# Patient Record
Sex: Female | Born: 1970 | Hispanic: Refuse to answer | Smoking: Never smoker
Health system: Southern US, Community
[De-identification: ages and names within clinical notes are randomized; demographics above are authoritative.]

## PROBLEM LIST (undated history)

## (undated) DIAGNOSIS — M1990 Unspecified osteoarthritis, unspecified site: Secondary | ICD-10-CM

## (undated) DIAGNOSIS — G4730 Sleep apnea, unspecified: Secondary | ICD-10-CM

## (undated) DIAGNOSIS — T7840XA Allergy, unspecified, initial encounter: Secondary | ICD-10-CM

## (undated) DIAGNOSIS — E785 Hyperlipidemia, unspecified: Secondary | ICD-10-CM

## (undated) DIAGNOSIS — I499 Cardiac arrhythmia, unspecified: Secondary | ICD-10-CM

## (undated) DIAGNOSIS — D219 Benign neoplasm of connective and other soft tissue, unspecified: Secondary | ICD-10-CM

## (undated) DIAGNOSIS — I10 Essential (primary) hypertension: Secondary | ICD-10-CM

## (undated) DIAGNOSIS — E119 Type 2 diabetes mellitus without complications: Secondary | ICD-10-CM

## (undated) HISTORY — DX: Type 2 diabetes mellitus without complications: E11.9

## (undated) HISTORY — DX: Unspecified osteoarthritis, unspecified site: M19.90

## (undated) HISTORY — PX: KNEE ARTHROSCOPY: SHX127

## (undated) HISTORY — DX: Hyperlipidemia, unspecified: E78.5

## (undated) HISTORY — DX: Benign neoplasm of connective and other soft tissue, unspecified: D21.9

## (undated) HISTORY — PX: CHOLECYSTECTOMY: SHX55

## (undated) HISTORY — DX: Essential (primary) hypertension: I10

## (undated) HISTORY — PX: ANKLE SURGERY: SHX546

## (undated) HISTORY — DX: Sleep apnea, unspecified: G47.30

## (undated) HISTORY — DX: Allergy, unspecified, initial encounter: T78.40XA

## (undated) HISTORY — PX: ABDOMINAL HYSTERECTOMY: SHX81

---

## 2021-04-29 ENCOUNTER — Other Ambulatory Visit (HOSPITAL_COMMUNITY): Payer: Self-pay

## 2021-04-29 MED ORDER — LOSARTAN POTASSIUM-HCTZ 100-25 MG PO TABS
1.0000 | ORAL_TABLET | Freq: Every day | ORAL | 1 refills | Status: DC
  Filled 2021-04-29: qty 30, 30d supply, fill #0
  Filled 2021-05-23: qty 30, 30d supply, fill #1

## 2021-04-29 MED ORDER — DILTIAZEM HCL ER COATED BEADS 360 MG PO CP24
360.0000 mg | ORAL_CAPSULE | Freq: Every day | ORAL | 1 refills | Status: DC
  Filled 2021-04-29 (×3): qty 30, 30d supply, fill #0
  Filled 2021-05-23: qty 30, 30d supply, fill #1

## 2021-05-23 ENCOUNTER — Other Ambulatory Visit (HOSPITAL_COMMUNITY): Payer: Self-pay

## 2021-09-29 ENCOUNTER — Encounter: Payer: Self-pay | Admitting: Family

## 2021-09-29 ENCOUNTER — Ambulatory Visit: Payer: Self-pay | Admitting: Family

## 2021-09-29 VITALS — BP 180/106 | HR 67 | Temp 98.8°F | Resp 16 | Ht 69.0 in | Wt 250.0 lb

## 2021-09-29 DIAGNOSIS — E119 Type 2 diabetes mellitus without complications: Secondary | ICD-10-CM

## 2021-09-29 DIAGNOSIS — E785 Hyperlipidemia, unspecified: Secondary | ICD-10-CM | POA: Insufficient documentation

## 2021-09-29 DIAGNOSIS — E1169 Type 2 diabetes mellitus with other specified complication: Secondary | ICD-10-CM

## 2021-09-29 DIAGNOSIS — E1159 Type 2 diabetes mellitus with other circulatory complications: Secondary | ICD-10-CM

## 2021-09-29 DIAGNOSIS — R21 Rash and other nonspecific skin eruption: Secondary | ICD-10-CM

## 2021-09-29 NOTE — Progress Notes (Signed)
Acute Office Visit  Subjective:    Patient ID: Brianne Maina, female    DOB: 05-Dec-1970, 50 y.o.   MRN: [id number]  Chief Complaint  Patient presents with   Rash    Rash This is a new problem. The current episode started 1 to 4 weeks ago. The problem has been gradually worsening since onset. The affected locations include the abdomen, groin, neck, chest, torso and back. The rash is characterized by itchiness and blistering. Associated with: Flu vaccine 1 week prior to rash starting. Past treatments include moisturizer and anti-itch cream. The treatment provided no relief.     No past medical history on file.  No family history on file.  Social History   Socioeconomic History   Marital status: Not on file    Spouse name: Not on file   Number of children: Not on file   Years of education: Not on file   Highest education level: Not on file  Occupational History   Not on file  Tobacco Use   Smoking status: Not on file   Smokeless tobacco: Not on file  Substance and Sexual Activity   Alcohol use: Not on file   Drug use: Not on file   Sexual activity: Not on file  Other Topics Concern   Not on file  Social History Narrative   Not on file   Social Determinants of Health   Financial Resource Strain: Not on file  Food Insecurity: Not on file  Transportation Needs: Not on file  Physical Activity: Not on file  Stress: Not on file  Social Connections: Not on file  Intimate Partner Violence: Not on file    Outpatient Medications Prior to Visit  Medication Sig Dispense Refill   aspirin EC 81 MG tablet Take 81 mg by mouth daily. Swallow whole.     diltiazem (CARDIZEM CD) 360 MG 24 hr capsule Take 1 capsule (360 mg total) by mouth daily. 30 capsule 1   losartan-hydrochlorothiazide (HYZAAR) 100-25 MG tablet Take 1 tablet by mouth daily. 30 tablet 1   metFORMIN (GLUCOPHAGE) 1000 MG tablet Take 1,000 mg by mouth 2 (two) times daily with a meal.     simvastatin (ZOCOR) 10 MG tablet  Take 10 mg by mouth daily.     No facility-administered medications prior to visit.    No Known Allergies  Review of Systems  Constitutional: Negative.   HENT: Negative.    Eyes: Negative.   Respiratory: Negative.    Cardiovascular: Negative.   Gastrointestinal: Negative.   Endocrine: Negative.   Genitourinary: Negative.   Musculoskeletal: Negative.   Skin:  Positive for rash.  Allergic/Immunologic: Positive for environmental allergies.  Neurological: Negative.   Hematological: Negative.   Psychiatric/Behavioral: Negative.        Objective:    Physical Exam Vitals (Patient with hypertension untreated due to lack of insurance) reviewed.  Constitutional:      Appearance: Normal appearance.  HENT:     Head: Normocephalic.  Eyes:     Pupils: Pupils are equal, round, and reactive to light.  Cardiovascular:     Rate and Rhythm: Normal rate and regular rhythm.  Pulmonary:     Effort: Pulmonary effort is normal.  Musculoskeletal:        General: Normal range of motion.  Skin:    General: Skin is warm.     Findings: Rash present.  Neurological:     General: No focal deficit present.     Mental Status: She is alert.  Psychiatric:        Mood and Affect: Mood normal.    BP (!) 180/106 (BP Location: Right Arm)   Pulse 67   Temp 98.8 F (37.1 C)   Resp 16   Ht 5\' 9"  (1.753 m)   Wt 250 lb (113.4 kg)   SpO2 97%   BMI 36.92 kg/m  Wt Readings from Last 3 Encounters:  09/29/21 250 lb (113.4 kg)    Health Maintenance Due  Topic Date Due   HEMOGLOBIN A1C  Never done   FOOT EXAM  Never done   OPHTHALMOLOGY EXAM  Never done   HIV Screening  Never done   Hepatitis C Screening  Never done   TETANUS/TDAP  Never done   PAP SMEAR-Modifier  Never done   COLONOSCOPY (Pts 45-34yrs Insurance coverage will need to be confirmed)  Never done   MAMMOGRAM  Never done   Zoster Vaccines- Shingrix (1 of 2) Never done   INFLUENZA VACCINE  Never done       Assessment & Plan:    Allergic rash started one week after having flu injection. Patient had adverse reaction to the flu vaccine in the past. I advised her to not receive influenza vaccine again.   Today I encouraged her to start using Benadryl tablets 25 mg one tablet every six hours. She should also use over-the-counter antibiotic ointment topically to open areas on skin.   If her rash does not improve by next week she should reach out to me and we will discuss treatment options for bacterial infection of skin. Currently she does not have health insurance until November 24. At that time she can afford prescription medications we discussed antibiotics if her rash continues to worsen.  We also discussed her finding a primary care physician. I sent her to the [facility] health website and showed her how to request a PCP. I have concerns because she's a diabetic and is currently not treating her chronic conditions due to lack of insurance. When her insurance begins I'll send prescriptions for her to refill.  [facility] for a follow-up visit.   [facility], NP

## 2021-10-13 ENCOUNTER — Other Ambulatory Visit (HOSPITAL_COMMUNITY): Payer: Self-pay

## 2021-10-13 LAB — HM HEPATITIS C SCREENING LAB: HM Hepatitis Screen: NEGATIVE

## 2021-10-13 LAB — MICROALBUMIN, URINE: Microalb, Ur: NEGATIVE

## 2021-10-13 LAB — TSH: TSH: 0.96 (ref <=5.90)

## 2021-10-13 LAB — HIV ANTIBODY (ROUTINE TESTING W REFLEX): HIV 1&2 Ab, 4th Generation: NONREACTIVE

## 2021-10-13 LAB — HEMOGLOBIN A1C: Hemoglobin A1C: 8.6

## 2021-10-13 LAB — HM HIV SCREENING LAB: HM HIV Screening: NEGATIVE

## 2021-10-13 MED ORDER — DILTIAZEM HCL ER 120 MG PO CP24
120.0000 mg | ORAL_CAPSULE | Freq: Every day | ORAL | 3 refills | Status: DC
  Filled 2021-10-13: qty 30, 30d supply, fill #0

## 2021-10-13 MED ORDER — METFORMIN HCL 1000 MG PO TABS
1000.0000 mg | ORAL_TABLET | Freq: Two times a day (BID) | ORAL | 3 refills | Status: DC
  Filled 2021-10-13: qty 60, 30d supply, fill #0

## 2021-10-13 MED ORDER — PRAVASTATIN SODIUM 10 MG PO TABS
10.0000 mg | ORAL_TABLET | Freq: Every day | ORAL | 3 refills | Status: DC
  Filled 2021-10-13: qty 30, 30d supply, fill #0
  Filled 2021-11-24: qty 30, 30d supply, fill #1

## 2021-10-13 MED ORDER — ASPIRIN 81 MG PO TBEC
81.0000 mg | DELAYED_RELEASE_TABLET | Freq: Every day | ORAL | 3 refills | Status: DC
  Filled 2022-04-21: qty 30, 30d supply, fill #0
  Filled 2022-05-21: qty 30, 30d supply, fill #1
  Filled 2022-06-20: qty 30, 30d supply, fill #2
  Filled 2022-07-17: qty 30, 30d supply, fill #3

## 2021-10-13 MED ORDER — DILTIAZEM HCL ER 240 MG PO CP24
240.0000 mg | ORAL_CAPSULE | Freq: Every day | ORAL | 3 refills | Status: DC
  Filled 2021-10-13: qty 30, 30d supply, fill #0

## 2021-10-13 MED ORDER — LOSARTAN POTASSIUM-HCTZ 100-25 MG PO TABS
1.0000 | ORAL_TABLET | Freq: Every day | ORAL | 3 refills | Status: DC
  Filled 2021-10-13: qty 30, 30d supply, fill #0
  Filled 2021-12-16: qty 30, 30d supply, fill #1

## 2021-10-14 LAB — VITAMIN D 25 HYDROXY (VIT D DEFICIENCY, FRACTURES): Vit D, 25-Hydroxy: 31.72

## 2021-10-14 LAB — HEPATITIS B SURFACE ANTIGEN: Hepatitis B Surface Ag: NONREACTIVE

## 2021-10-27 ENCOUNTER — Other Ambulatory Visit (HOSPITAL_COMMUNITY): Payer: Self-pay

## 2021-10-27 MED ORDER — ACCU-CHEK SOFTCLIX LANCETS MISC
1 refills | Status: AC
  Filled 2021-10-27: qty 100, 30d supply, fill #0
  Filled 2021-11-24: qty 100, 30d supply, fill #1

## 2021-10-27 MED ORDER — METFORMIN HCL 1000 MG PO TABS
1000.0000 mg | ORAL_TABLET | Freq: Two times a day (BID) | ORAL | 3 refills | Status: DC
  Filled 2021-10-27 – 2021-11-22 (×2): qty 60, 30d supply, fill #0
  Filled 2021-12-16: qty 60, 30d supply, fill #1

## 2021-10-27 MED ORDER — GLIPIZIDE ER 2.5 MG PO TB24
2.5000 mg | ORAL_TABLET | Freq: Every day | ORAL | 3 refills | Status: DC
  Filled 2021-10-27: qty 30, 30d supply, fill #0
  Filled 2021-11-24: qty 30, 30d supply, fill #1

## 2021-10-27 MED ORDER — PRAVASTATIN SODIUM 10 MG PO TABS
10.0000 mg | ORAL_TABLET | Freq: Every day | ORAL | 3 refills | Status: DC
  Filled 2021-10-27 – 2021-12-21 (×2): qty 30, 30d supply, fill #0

## 2021-10-27 MED ORDER — GLUCOSE BLOOD VI STRP
ORAL_STRIP | 1 refills | Status: DC
  Filled 2021-10-27: qty 100, 50d supply, fill #0
  Filled 2022-04-11: qty 100, 50d supply, fill #1

## 2021-10-27 MED ORDER — LOSARTAN POTASSIUM-HCTZ 100-25 MG PO TABS
1.0000 | ORAL_TABLET | Freq: Every day | ORAL | 3 refills | Status: DC
  Filled 2021-10-27 – 2021-11-22 (×2): qty 30, 30d supply, fill #0

## 2021-10-27 MED ORDER — BLOOD GLUCOSE MONITOR SYSTEM W/DEVICE KIT
PACK | 0 refills | Status: AC
  Filled 2021-10-27: qty 1, 1d supply, fill #0

## 2021-10-27 MED ORDER — AMLODIPINE BESYLATE 5 MG PO TABS
5.0000 mg | ORAL_TABLET | Freq: Every day | ORAL | 3 refills | Status: DC
  Filled 2021-10-27: qty 30, 30d supply, fill #0
  Filled 2021-11-24: qty 30, 30d supply, fill #1

## 2021-10-31 ENCOUNTER — Ambulatory Visit: Payer: Self-pay | Admitting: Podiatry

## 2021-11-22 ENCOUNTER — Other Ambulatory Visit (HOSPITAL_COMMUNITY): Payer: Self-pay

## 2021-11-23 ENCOUNTER — Other Ambulatory Visit (HOSPITAL_COMMUNITY): Payer: Self-pay

## 2021-11-23 LAB — HEPATIC FUNCTION PANEL
ALT: 23 (ref 7–35)
AST: 21 (ref 13–35)
Alkaline Phosphatase: 79 (ref 25–125)

## 2021-11-23 LAB — LIPID PANEL
Cholesterol: 212 — AB (ref 0–200)
HDL: 54 (ref 35–70)
LDL Cholesterol: 134
LDl/HDL Ratio: 24
Triglycerides: 121 (ref 40–160)

## 2021-11-23 LAB — BASIC METABOLIC PANEL
BUN: 18 (ref 4–21)
CO2: 28 — AB (ref 13–22)
Chloride: 100 (ref 99–108)
Creatinine: 0.8 (ref 0.5–1.1)
Glucose: 101
Potassium: 3.9 (ref 3.4–5.3)
Sodium: 138 (ref 137–147)

## 2021-11-23 LAB — COMPREHENSIVE METABOLIC PANEL
Albumin: 4.6 (ref 3.5–5.0)
Calcium: 10 (ref 8.7–10.7)
Globulin: 3.6
eGFR: 81

## 2021-11-23 LAB — CBC AND DIFFERENTIAL
HCT: 43 (ref 36–46)
Hemoglobin: 14 (ref 12.0–16.0)
Platelets: 303 (ref 150–399)
WBC: 6

## 2021-11-23 LAB — CBC: RBC: 4.97 (ref 3.87–5.11)

## 2021-11-23 MED ORDER — METHOCARBAMOL 500 MG PO TABS
500.0000 mg | ORAL_TABLET | Freq: Two times a day (BID) | ORAL | 1 refills | Status: DC
  Filled 2021-11-23: qty 20, 10d supply, fill #0

## 2021-11-24 ENCOUNTER — Other Ambulatory Visit: Payer: Self-pay

## 2021-11-24 ENCOUNTER — Ambulatory Visit (INDEPENDENT_AMBULATORY_CARE_PROVIDER_SITE_OTHER): Payer: PRIVATE HEALTH INSURANCE | Admitting: Podiatry

## 2021-11-24 ENCOUNTER — Ambulatory Visit (INDEPENDENT_AMBULATORY_CARE_PROVIDER_SITE_OTHER): Payer: PRIVATE HEALTH INSURANCE

## 2021-11-24 ENCOUNTER — Other Ambulatory Visit (HOSPITAL_COMMUNITY): Payer: Self-pay

## 2021-11-24 DIAGNOSIS — M76829 Posterior tibial tendinitis, unspecified leg: Secondary | ICD-10-CM

## 2021-11-24 DIAGNOSIS — M76821 Posterior tibial tendinitis, right leg: Secondary | ICD-10-CM

## 2021-11-24 DIAGNOSIS — M79671 Pain in right foot: Secondary | ICD-10-CM

## 2021-11-24 MED ORDER — METHYLPREDNISOLONE 4 MG PO TBPK
ORAL_TABLET | ORAL | 0 refills | Status: DC
  Filled 2021-11-24: qty 21, 6d supply, fill #0

## 2021-11-24 NOTE — Patient Instructions (Signed)
Posterior Tibial Tendinitis  Posterior tibial tendinitis is irritation of a tendon called the posterior tibial tendon. Your posterior tibial tendon is a cord-like tissue that connects bones of your lower leg and foot to a muscle that: Supports your arch. Helps you raise up on your toes. Helps you turn your foot down and in. This condition causes foot and ankle pain. It can also lead to a flat foot. What are the causes? This condition is most often caused by repeated stress to the tendon (overuse injury). It can also be caused by a sudden injury that stresses the tendon, such as landing on your foot after jumping or falling. What increases the risk? This condition is more likely to develop in: People who play a sport that involves putting a lot of pressure on the feet, such as: Basketball. Tennis. Soccer. Hockey. Runners. Females who are older than 52 years of age and are overweight. People with diabetes. People with decreased foot stability. People with flat feet. What are the signs or symptoms? Symptoms include: Pain in the inner ankle. Pain at the arch of your foot. Pain that gets worse with running, walking, or standing. Swelling on the inside of your ankle and foot. Weakness in your ankle or foot. Inability to stand up on tiptoe. Flattening of the arch of your foot. How is this diagnosed? This condition may be diagnosed based on: Your symptoms. Your medical history. A physical exam. Tests, such as: X-ray. MRI. Ultrasound. How is this treated? This condition may be treated by: Putting ice to the injured area. Taking NSAIDs, such as ibuprofen, to reduce pain and swelling. Wearing a special shoe or shoe insert to support your arch (orthotic). Having physical therapy. Replacing high-impact exercise with low-impact exercise, such as swimming or cycling. If your symptoms do not improve with these treatments, you may need to wear a splint, removable walking boot, or short  leg cast for 6-8 weeks to keep your foot and ankle still (immobilized). Follow these instructions at home: If you have a cast, splint, or boot: Keep it clean and dry. Check the skin around it every day. Tell your health care provider about any concerns. If you have a cast: Do not stick anything inside it to scratch your skin. Doing that increases your risk of infection. You may put lotion on dry skin around the edges of the cast. Do not put lotion on the skin underneath the cast. If you have a splint or boot: Wear it as told by your health care provider. Remove it only as told by your health care provider. Loosen it if your toes tingle, become numb, or turn cold and blue. Bathing Do not take baths, swim, or use a hot tub until your health care provider approves. Ask your health care provider if you may take showers. If your cast, splint, or boot is not waterproof: Do not let it get wet. Cover it with a waterproof covering while you take a bath or a shower. Managing pain and swelling   If directed, put ice on the injured area. If you have a removable splint or boot, remove it as told by your health care provider. Put ice in a plastic bag. Place a towel between your skin and the bag or between your cast and the bag. Leave the ice on for 20 minutes, 2-3 times a day. Move your toes often to reduce stiffness and swelling. Raise (elevate) the injured area above the level of your heart while you are sitting   or lying down. Activity Do not use the injured foot to support your body weight until your health care provider says that you can. Use crutches as told by your health care provider. Do not do activities that make pain or swelling worse. Ask your health care provider when it is safe to drive if you have a cast, splint, or boot on your foot. Return to your normal activities as told by your health care provider. Ask your health care provider what activities are safe for you. Do exercises as  told by your health care provider. General instructions Take over-the-counter and prescription medicines only as told by your health care provider. If you have an orthotic, use it as told by your health care provider. Keep all follow-up visits as told by your health care provider. This is important. How is this prevented? Wear footwear that is appropriate to your athletic activity. Avoid athletic activities that cause pain or swelling in your ankle or foot. Before being active, do range-of-motion and stretching exercises. If you develop pain or swelling while training, stop training. If you have pain or swelling that does not improve after a few days of rest, see your health care provider. If you start a new athletic activity, start gradually so you can build up your strength and flexibility. Contact a health care provider if: Your symptoms get worse. Your symptoms do not improve in 6-8 weeks. You develop new, unexplained symptoms. Your splint, boot, or cast gets damaged. Summary Posterior tibial tendinitis is irritation of a tendon called the posterior tibial tendon. This condition is most often caused by repeated stress to the tendon (overuse injury). This condition causes foot pain and ankle pain. It can also lead to a flat foot. This condition may be treated by not doing high-impact activities, applying ice, having physical therapy, wearing orthotics, and wearing a cast, splint, or boot if needed. This information is not intended to replace advice given to you by your health care provider. Make sure you discuss any questions you have with your health care provider. Document Revised: 02/25/2019 Document Reviewed: 01/02/2019 Elsevier Patient Education  2020 [employer]  Posterior Tibial Tendinitis Rehab Ask your health care provider which exercises are safe for you. Do exercises exactly as told by your health care provider and adjust them as directed. It is normal to feel mild  stretching, pulling, tightness, or discomfort as you do these exercises. Stop right away if you feel sudden pain or your pain gets worse. Do not begin these exercises until told by your health care provider. Stretching and range-of-motion exercises These exercises warm up your muscles and joints and improve the movement and flexibility in your ankle and foot. These exercises may also help to relieve pain. Standing wall calf stretch, knee straight   Stand with your hands against a wall. Extend your left / right leg behind you, and bend your front knee slightly. If directed, place a folded washcloth under the arch of your foot for support. Point the toes of your back foot slightly inward. Keeping your heels on the floor and your back knee straight, shift your weight toward the wall. Do not allow your back to arch. You should feel a gentle stretch in your upper left / right calf. Hold this position for 10 seconds. Repeat 10 times. Complete this exercise 2 times a day. Standing wall calf stretch, knee bent Stand with your hands against a wall. Extend your left / right leg behind you, and bend your front   knee slightly. If directed, place a folded washcloth under the arch of your foot for support. Point the toes of your back foot slightly inward. Unlock your back knee so it is bent. Keep your heels on the floor. You should feel a gentle stretch deep in your lower left / right calf. Hold this position for 10 seconds. Repeat 10 times. Complete this exercise 2 times a day. Strengthening exercises These exercises build strength and endurance in your ankle and foot. Endurance is the ability to use your muscles for a long time, even after they get tired. Ankle inversion with band Secure one end of a rubber exercise band or tubing to a fixed object, such as a table leg or a pole, that will stay still when the band is pulled. Loop the other end of the band around the middle of your left / right foot. Sit  on the floor facing the object with your left / right leg extended. The band or tube should be slightly tense when your foot is relaxed. Leading with your big toe, slowly bring your left / right foot and ankle inward, toward your other foot (inversion). Hold this position for 10 seconds. Slowly return your foot to the starting position. Repeat 10 times. Complete this exercise 2 times a day. Towel curls   Sit in a chair on a non-carpeted surface, and put your feet on the floor. Place a towel in front of your feet. Keeping your heel on the floor, put your left / right foot on the towel. Pull the towel toward you by grabbing the towel with your toes and curling them under. Keep your heel on the floor while you do this. Let your toes relax. Grab the towel with your toes again. Keep going until the towel is completely underneath your foot. Repeat 10 times. Complete this exercise 2 times a day. Balance exercise This exercise improves or maintains your balance. Balance is important in preventing falls. Single leg stand Without wearing shoes, stand near a railing or in a doorway. You may hold on to the railing or door frame as needed for balance. Stand on your left / right foot. Keep your big toe down on the floor and try to keep your arch lifted. If balancing in this position is too easy, try the exercise with your eyes closed or while standing on a pillow. Hold this position for 10 seconds. Repeat 10 times. Complete this exercise 2 times a day. This information is not intended to replace advice given to you by your health care provider. Make sure you discuss any questions you have with your health care provider.  

## 2021-11-27 ENCOUNTER — Encounter: Payer: Self-pay | Admitting: Podiatry

## 2021-11-27 NOTE — Progress Notes (Signed)
°  Subjective:  Patient ID: Stephanie Mccall, female    DOB: Jan 21, 1971,  MRN: [id number]  No chief complaint on file.   51 y.o. female presents with the above complaint. History confirmed with patient.  She states she is a diabetic and in May fell and has been having problems with her foot since then.  Most the pain is on the inside ankle radiating into the arch  Objective:  Physical Exam: warm, good capillary refill, no trophic changes or ulcerative lesions, normal DP and PT pulses, and normal sensory exam. Left Foot: normal exam, no swelling, tenderness, instability; ligaments intact, full range of motion of all ankle/foot joints Right Foot:  Pain with range of motion palpation along the PT tendon radiating into the arch  No images are attached to the encounter.  Radiographs: Multiple views x-ray of the right foot: no fracture, dislocation, swelling or degenerative changes noted and pes planus is noted Assessment:   1. PTTD (posterior tibial tendon dysfunction)      Plan:  Patient was evaluated and treated and all questions answered.  Discussed the etiology and treatment options for posterior tibial tendinitis including stretching, formal physical therapy with an eccentric exercises therapy plan, supportive shoegears such as a running shoe or sneaker, heel lifts, topical and oral medications.  We also discussed that I do not routinely perform injections in this area because of the risk of an increased damage or rupture of the tendon.  We also discussed the role of surgical treatment of this for patients who do not improve after exhausting non-surgical treatment options.  -XR reviewed with patient -Educated on stretching and icing of the affected limb. -Rx for Medrol 6-day taper. Advised on risks, benefits, and alternatives of the medication -Placed in a short CAM boot and compression anklet for support  Return in about 1 month (around 12/25/2021) for re-check PT tendinitis.

## 2021-12-16 ENCOUNTER — Other Ambulatory Visit (HOSPITAL_COMMUNITY): Payer: Self-pay

## 2021-12-21 ENCOUNTER — Other Ambulatory Visit (HOSPITAL_COMMUNITY): Payer: Self-pay

## 2021-12-27 ENCOUNTER — Ambulatory Visit: Payer: Self-pay | Admitting: Podiatry

## 2021-12-30 ENCOUNTER — Other Ambulatory Visit: Payer: Self-pay

## 2021-12-30 ENCOUNTER — Encounter: Payer: Self-pay | Admitting: Family Medicine

## 2021-12-30 ENCOUNTER — Ambulatory Visit: Payer: PRIVATE HEALTH INSURANCE | Admitting: Family Medicine

## 2021-12-30 ENCOUNTER — Other Ambulatory Visit (HOSPITAL_COMMUNITY): Payer: Self-pay

## 2021-12-30 VITALS — BP 134/86 | HR 88 | Temp 98.0°F | Ht 66.0 in | Wt 259.5 lb

## 2021-12-30 DIAGNOSIS — E785 Hyperlipidemia, unspecified: Secondary | ICD-10-CM

## 2021-12-30 DIAGNOSIS — R1030 Lower abdominal pain, unspecified: Secondary | ICD-10-CM | POA: Diagnosis not present

## 2021-12-30 DIAGNOSIS — E1169 Type 2 diabetes mellitus with other specified complication: Secondary | ICD-10-CM

## 2021-12-30 DIAGNOSIS — E119 Type 2 diabetes mellitus without complications: Secondary | ICD-10-CM

## 2021-12-30 DIAGNOSIS — F5101 Primary insomnia: Secondary | ICD-10-CM

## 2021-12-30 DIAGNOSIS — M546 Pain in thoracic spine: Secondary | ICD-10-CM

## 2021-12-30 DIAGNOSIS — E1159 Type 2 diabetes mellitus with other circulatory complications: Secondary | ICD-10-CM

## 2021-12-30 DIAGNOSIS — I152 Hypertension secondary to endocrine disorders: Secondary | ICD-10-CM

## 2021-12-30 DIAGNOSIS — M545 Low back pain, unspecified: Secondary | ICD-10-CM

## 2021-12-30 LAB — POCT URINALYSIS DIPSTICK
Bilirubin, UA: NEGATIVE
Blood, UA: NEGATIVE
Glucose, UA: NEGATIVE
Ketones, UA: NEGATIVE
Leukocytes, UA: NEGATIVE
Nitrite, UA: NEGATIVE
Protein, UA: NEGATIVE
Spec Grav, UA: 1.02 (ref 1.010–1.025)
Urobilinogen, UA: 0.2 E.U./dL
pH, UA: 6 (ref 5.0–8.0)

## 2021-12-30 MED ORDER — AMLODIPINE BESYLATE 5 MG PO TABS
5.0000 mg | ORAL_TABLET | Freq: Every day | ORAL | 1 refills | Status: DC
  Filled 2021-12-30: qty 30, 30d supply, fill #0
  Filled 2022-02-06: qty 30, 30d supply, fill #1
  Filled 2022-03-07: qty 30, 30d supply, fill #2

## 2021-12-30 MED ORDER — PRAVASTATIN SODIUM 10 MG PO TABS
10.0000 mg | ORAL_TABLET | Freq: Every day | ORAL | 3 refills | Status: DC
  Filled 2021-12-30: qty 90, 90d supply, fill #0
  Filled 2022-02-06: qty 30, 30d supply, fill #0
  Filled 2022-03-07: qty 30, 30d supply, fill #1
  Filled 2022-03-27 – 2022-04-11 (×2): qty 30, 30d supply, fill #2
  Filled 2022-05-12: qty 30, 30d supply, fill #3
  Filled 2022-06-09: qty 30, 30d supply, fill #4

## 2021-12-30 MED ORDER — GLIPIZIDE ER 2.5 MG PO TB24
2.5000 mg | ORAL_TABLET | Freq: Every day | ORAL | 1 refills | Status: DC
  Filled 2021-12-30: qty 30, 30d supply, fill #0
  Filled 2022-02-06: qty 30, 30d supply, fill #1
  Filled 2022-03-07: qty 30, 30d supply, fill #2
  Filled 2022-04-11: qty 30, 30d supply, fill #3
  Filled 2022-05-12: qty 30, 30d supply, fill #4
  Filled 2022-06-09: qty 30, 30d supply, fill #5

## 2021-12-30 MED ORDER — LOSARTAN POTASSIUM-HCTZ 100-25 MG PO TABS
1.0000 | ORAL_TABLET | Freq: Every day | ORAL | 3 refills | Status: DC
  Filled 2021-12-30 – 2022-01-23 (×2): qty 30, 30d supply, fill #0
  Filled 2022-02-24: qty 30, 30d supply, fill #1
  Filled 2022-03-27: qty 30, 30d supply, fill #2
  Filled 2022-04-21: qty 30, 30d supply, fill #3
  Filled 2022-05-21: qty 30, 30d supply, fill #4
  Filled 2022-06-20: qty 30, 30d supply, fill #5
  Filled 2022-07-17: qty 30, 30d supply, fill #6
  Filled 2022-08-19: qty 30, 30d supply, fill #7
  Filled 2022-09-18: qty 30, 30d supply, fill #8
  Filled 2022-10-23: qty 30, 30d supply, fill #9
  Filled 2022-11-17 – 2022-11-27 (×3): qty 30, 30d supply, fill #10
  Filled 2022-12-24: qty 30, 30d supply, fill #11

## 2021-12-30 MED ORDER — CYCLOBENZAPRINE HCL 10 MG PO TABS
10.0000 mg | ORAL_TABLET | Freq: Every day | ORAL | 5 refills | Status: DC
  Filled 2021-12-30: qty 30, 30d supply, fill #0
  Filled 2022-04-21: qty 30, 30d supply, fill #1
  Filled 2022-05-21: qty 30, 30d supply, fill #2
  Filled 2022-06-20: qty 30, 30d supply, fill #3
  Filled 2022-07-17: qty 30, 30d supply, fill #4

## 2021-12-30 MED ORDER — METFORMIN HCL 1000 MG PO TABS
1000.0000 mg | ORAL_TABLET | Freq: Two times a day (BID) | ORAL | 1 refills | Status: DC
  Filled 2021-12-30 – 2022-01-23 (×2): qty 60, 30d supply, fill #0
  Filled 2022-02-24: qty 60, 30d supply, fill #1
  Filled 2022-03-27: qty 60, 30d supply, fill #2
  Filled 2022-04-21: qty 60, 30d supply, fill #3
  Filled 2022-05-21: qty 60, 30d supply, fill #4
  Filled 2022-06-20: qty 60, 30d supply, fill #5

## 2021-12-30 NOTE — Patient Instructions (Signed)
Welcome to [facility] at [facility]! It was a pleasure meeting you today.  As discussed, Please schedule a 6 month follow up visit today.  PLEASE NOTE:  If you had any LAB tests please let us know if you have not heard back within a few days. You may see your results on MyChart before we have a chance to review them but we will give you a call once they are reviewed by us. If we ordered any REFERRALS today, please let us know if you have not heard from their office within the next week.  Let us know through MyChart if you are needing REFILLS, or have your pharmacy send us the request. You can also use MyChart to communicate with me or any office staff.  Please try these tips to maintain a healthy lifestyle:  Eat most of your calories during the day when you are active. Eliminate processed foods including packaged sweets (pies, cakes, cookies), reduce intake of potatoes, white bread, white pasta, and white rice. Look for whole grain options, oat flour or almond flour.  Each meal should contain half fruits/vegetables, one quarter protein, and one quarter carbs (no bigger than a computer mouse).  Cut down on sweet beverages. This includes juice, soda, and sweet tea. Also watch fruit intake, though this is a healthier sweet option, it still contains natural sugar! Limit to 3 servings daily.  Drink at least 1 glass of water with each meal and aim for at least 8 glasses per day  Exercise at least 150 minutes every week.   

## 2021-12-30 NOTE — Progress Notes (Signed)
New Patient Office Visit  Subjective:  Patient ID: Stephanie Mccall, female    DOB: 07-31-1971  Age: 51 y.o. MRN: [id number]  CC:  Chief Complaint  Patient presents with   Establish Care    Not fasting    Back Pain    Lower back pain that started 2 weeks ago    Abdominal Pain    Discomfort in lower abdomen that started 1 week ago     HPI Stephanie Mccall presents for new pt to establish Moved from [place] last yr.  Was seeing Doc at [facility]. Has lost wt since moved here.  DM type 2-175 this am but ate late.  Usu 120-130.  Labs done 11/23/21.  Told A1C a little high HTN-occ 175. Works a lot of double shifts and not sleep well.  Doing ok mostly. Occ HA-excedrin.  Saw PCP 1/11-was having CP and pain mid back-did x-ray and ekg-ok.  Wanted to do CT spine and pt not want to go to [facility] for CT of spine.  Occ cp-anytime. No other symptoms w/it x can be in back. If leans forward, neck/upper back hurt.   No cough/sob/edema HLD-changed from zocor to prvastatin "doc said don't need that one". Lower back pain for 2 wks.achey.  no injury. No n/t. No weakness. Poss some tingly lower abd. No dysuria.  [place] lower ab and can go to back. No blood. Some freq urine. No f/c/n/v/d/c. Stressed. No SI.  Doesn't sleep well.  Gets about 2-3 hrs.   No traumatic events but worse since on lost at sleep.  Benadryl not help.   Past Medical History:  Diagnosis Date   Diabetes mellitus without complication (HCC)    Hyperlipidemia    Hypertension     Past Surgical History:  Procedure Laterality Date   ABDOMINAL HYSTERECTOMY     partial. fibroids   ANKLE SURGERY Left    screws to straighten   CHOLECYSTECTOMY     KNEE ARTHROSCOPY Right     History reviewed. No pertinent family history.  Social History   Socioeconomic History   Marital status: Legally Separated    Spouse name: Not on file   Number of children: 2   Years of education: Not on file   Highest education level: Not on file  Occupational  History   Not on file  Tobacco Use   Smoking status: Never   Smokeless tobacco: Never  Vaping Use   Vaping Use: Every day   Substances: Nicotine, Flavoring   Devices: tried once  Substance and Sexual Activity   Alcohol use: Yes    Comment: occasoinally   Drug use: Never   Sexual activity: Yes  Other Topics Concern   Not on file  Social History Narrative   Daughter in [place], son dec 29-drowned at sea   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 Tonsils large NECK:  supple, no thyromegaly, no nodes, no carotid bruits CARDIAC: RRR, S1S2+, no murmur. DP 2+B LUNGS: CTAB. No wheezes ABDOMEN:  BS+, soft, NTND, No HSM, no masses.  ?L CVAT EXT:  no edema MSK: no gross abnormalities. MS 5/5 all 4.  No TTP spine but some tenders B SI joints NEURO: A&O x3.  CN II-XII intact.  PSYCH: normal mood. Good eye contact   Results for orders placed or performed in visit on 12/30/21  POCT urinalysis dipstick  Result Value Ref Range   Color, UA clear    Clarity, UA yellow    Glucose, UA Negative Negative   Bilirubin, UA negative    Ketones, UA negative    Spec Grav, UA 1.020 1.010 - 1.025   Blood, UA negative    pH, UA 6.0 5.0 - 8.0   Protein, UA Negative Negative   Urobilinogen, UA 0.2 0.2 or 1.0 E.U./dL   Nitrite, UA negative    Leukocytes, UA Negative Negative   Appearance     Odor       Assessment & Plan:   Problem List Items  Addressed This Visit       Cardiovascular and Mediastinum   Hypertension associated with diabetes (HCC)   Relevant Medications   amLODipine (NORVASC) 5 MG tablet   glipiZIDE (GLUCOTROL XL) 2.5 MG 24 hr tablet   losartan-hydrochlorothiazide (HYZAAR) 100-25 MG tablet   metFORMIN (GLUCOPHAGE) 1000 MG tablet   pravastatin (PRAVACHOL) 10 MG tablet     Endocrine   Controlled type 2 diabetes mellitus without complication, without long-term current use of insulin (HCC) - Primary   Relevant Medications   glipiZIDE (GLUCOTROL XL) 2.5 MG 24 hr tablet   losartan-hydrochlorothiazide (HYZAAR) 100-25 MG tablet   metFORMIN (GLUCOPHAGE) 1000 MG tablet   pravastatin (PRAVACHOL) 10 MG tablet   Hyperlipidemia associated with type 2 diabetes mellitus (HCC)   Relevant Medications   amLODipine (NORVASC) 5 MG tablet   glipiZIDE (GLUCOTROL XL) 2.5 MG 24 hr tablet   losartan-hydrochlorothiazide (HYZAAR) 100-25 MG tablet   metFORMIN (GLUCOPHAGE) 1000 MG tablet   pravastatin (PRAVACHOL) 10 MG tablet     Other   Morbid obesity (HCC)   Relevant Medications   glipiZIDE (GLUCOTROL XL) 2.5 MG 24 hr tablet   metFORMIN (GLUCOPHAGE) 1000 MG tablet   Other Visit Diagnoses     Lower abdominal pain       Relevant Orders   POCT urinalysis dipstick (Completed)   Acute bilateral low back pain without sciatica       Relevant Medications   cyclobenzaprine (FLEXERIL) 10 MG tablet   Thoracic spine pain       Relevant Medications   cyclobenzaprine (FLEXERIL) 10 MG tablet   Primary insomnia         1.  Type 2 diabetes with hypertension-Per patient controlled.  Just had labs 1 month ago.  Will get records.  Continue meds.  Work on [employer].  Follow-up in 3 months 2.  Hypertension-mostly controlled on medications.  Does have some spikes, which may be from chronic insomnia.  Continue meds.  Continue to monitor. 3.  Hyperlipidemia-meds were changed from simvastatin to pravastatin-I suspect possibly because patient was  placed on amlodipine.  Will await records 4.  Low back pain-Will check urinalysis.  Take ibuprofen.  If continues to worsen or not improving, let me know 5.  Abdominal pain-urinalysis was negative.  Will  monitor for now.  Take Tylenol/ibuprofen 6.  Thoracic back pain-Will get records from previous PCP.  Patient is okay with this plan waiting to see what he was trying to order. 7.  Insomnia-chronic.  Also has some neck pain etc.  Will trial Flexeril for sleep and pain. 8.  Morbid obesity-patient higher risk with diabetes hypertension hyperlipidemia.  She is currently working on diet/exercise.  Outpatient Encounter Medications as of 12/30/2021  Medication Sig   Accu-Chek Softclix Lancets lancets Test up to twice daily as directed in the morning fasting and when hypoglycemic   aspirin 81 MG EC tablet Take 1 tablet (81 mg total) by mouth daily.   Blood Glucose Monitoring Suppl (BLOOD GLUCOSE MONITOR SYSTEM) w/Device KIT Test up to twice daily as directed in the morning fasting and when hypoglycemic   cyclobenzaprine (FLEXERIL) 10 MG tablet Take 1 tablet (10 mg total) by mouth at bedtime.   glucose blood test strip Test up to twice daily as directed in the morning fasting and when hypoglycemic   [DISCONTINUED] amLODipine (NORVASC) 5 MG tablet Take 1 tablet (5 mg total) by mouth daily.   [DISCONTINUED] glipiZIDE (GLUCOTROL XL) 2.5 MG 24 hr tablet Take 1 tablet (2.5 mg total) by mouth daily.   [DISCONTINUED] losartan-hydrochlorothiazide (HYZAAR) 100-25 MG tablet Take 1 tablet by mouth daily.   [DISCONTINUED] metFORMIN (GLUCOPHAGE) 1000 MG tablet Take 1 tablet (1,000 mg total) by mouth 2 (two) times daily.   [DISCONTINUED] pravastatin (PRAVACHOL) 10 MG tablet Take 1 tablet (10 mg total) by mouth daily.   amLODipine (NORVASC) 5 MG tablet Take 1 tablet (5 mg total) by mouth daily.   glipiZIDE (GLUCOTROL XL) 2.5 MG 24 hr tablet Take 1 tablet (2.5 mg total) by mouth daily.   losartan-hydrochlorothiazide  (HYZAAR) 100-25 MG tablet Take 1 tablet by mouth daily.   metFORMIN (GLUCOPHAGE) 1000 MG tablet Take 1 tablet (1,000 mg total) by mouth 2 (two) times daily.   pravastatin (PRAVACHOL) 10 MG tablet Take 1 tablet (10 mg total) by mouth daily.   [DISCONTINUED] aspirin EC 81 MG tablet Take 81 mg by mouth daily. Swallow whole.   [DISCONTINUED] diltiazem (CARDIZEM CD) 360 MG 24 hr capsule Take 1 capsule (360 mg total) by mouth daily.   [DISCONTINUED] diltiazem (DILACOR XR) 120 MG 24 hr capsule Take 1 capsule (120 mg total) by mouth daily. Take along with $RemoveBe'240mg'xlEQOJKrQ$  dose to equal $Remove'360mg'EHddBon$  daily   [DISCONTINUED] losartan-hydrochlorothiazide (HYZAAR) 100-25 MG tablet Take 1 tablet by mouth daily.   [DISCONTINUED] losartan-hydrochlorothiazide (HYZAAR) 100-25 MG tablet Take 1 tablet by mouth daily.   [DISCONTINUED] metFORMIN (GLUCOPHAGE) 1000 MG tablet Take 1,000 mg by mouth 2 (two) times daily with a meal.   [DISCONTINUED] metFORMIN (GLUCOPHAGE) 1000 MG tablet Take 1 tablet (1,000 mg total) by mouth 2 (two) times daily.   [DISCONTINUED] methocarbamol (ROBAXIN) 500 MG tablet Take 1 tablet (500 mg total) by mouth 2 (two) times daily. (Patient not taking: Reported on 12/30/2021)   [DISCONTINUED] methylPREDNISolone (MEDROL DOSEPAK) 4 MG TBPK tablet Take as directed (Patient not taking: Reported on 12/30/2021)   [DISCONTINUED] pravastatin (PRAVACHOL) 10 MG tablet Take 1 tablet (10 mg total) by mouth daily.   [DISCONTINUED] simvastatin (ZOCOR) 10 MG tablet Take 10 mg by mouth daily. (Patient not taking: Reported on 12/30/2021)   No facility-administered encounter medications on file as of 12/30/2021.    Follow-up: Return in about 3 months (around 03/29/2022) for sign release for labs x-rays, ekg and past 1 yr of records  f/u 3 mo dm.   Wellington Hampshire, MD

## 2022-01-09 ENCOUNTER — Ambulatory Visit: Payer: PRIVATE HEALTH INSURANCE | Admitting: Podiatry

## 2022-01-11 ENCOUNTER — Encounter: Payer: Self-pay | Admitting: Family Medicine

## 2022-01-23 ENCOUNTER — Other Ambulatory Visit (HOSPITAL_COMMUNITY): Payer: Self-pay

## 2022-02-06 ENCOUNTER — Other Ambulatory Visit (HOSPITAL_COMMUNITY): Payer: Self-pay

## 2022-02-24 ENCOUNTER — Other Ambulatory Visit (HOSPITAL_COMMUNITY): Payer: Self-pay

## 2022-03-02 ENCOUNTER — Ambulatory Visit (INDEPENDENT_AMBULATORY_CARE_PROVIDER_SITE_OTHER): Payer: PRIVATE HEALTH INSURANCE

## 2022-03-02 ENCOUNTER — Ambulatory Visit: Payer: PRIVATE HEALTH INSURANCE | Admitting: Orthopedic Surgery

## 2022-03-02 DIAGNOSIS — M25572 Pain in left ankle and joints of left foot: Secondary | ICD-10-CM | POA: Diagnosis not present

## 2022-03-07 ENCOUNTER — Other Ambulatory Visit (HOSPITAL_COMMUNITY): Payer: Self-pay

## 2022-03-14 ENCOUNTER — Encounter: Payer: Self-pay | Admitting: Orthopedic Surgery

## 2022-03-14 DIAGNOSIS — M25572 Pain in left ankle and joints of left foot: Secondary | ICD-10-CM | POA: Diagnosis not present

## 2022-03-14 MED ORDER — LIDOCAINE HCL 1 % IJ SOLN
2.0000 mL | INTRAMUSCULAR | Status: AC | PRN
  Administered 2022-03-14: 2 mL

## 2022-03-14 MED ORDER — METHYLPREDNISOLONE ACETATE 40 MG/ML IJ SUSP
40.0000 mg | INTRAMUSCULAR | Status: AC | PRN
  Administered 2022-03-14: 40 mg via INTRA_ARTICULAR

## 2022-03-14 NOTE — Progress Notes (Signed)
? ?Office Visit Note ?  ?Patient: Stephanie Mccall           ?Date of Birth: [birth year]           ?MRN: [id number] ?Visit Date: 03/02/2022 ?             ?Requested by: Jeani Sow, MD ?[address] ?[place],  [place] [address] ?PCP: Jeani Sow, MD ? ?Chief Complaint  ?Patient presents with  ? Right Ankle - Pain  ? Left Ankle - Pain  ? ? ? ? ?HPI: ?Patient is a 51 year old woman who is seen for initial evaluation left ankle and left hindfoot pain.  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  Patient states that she has received prednisone use the fracture boot retching and compression.  Patient states that nothing has helped with pain on the medial lateral aspect of her ankle. ? ?Patient states she is also had surgery on the left foot December 2020 and has painful retained hardware. ? ?Assessment & Plan: ?Visit Diagnoses:  ?1. Pain in left ankle and joints of left foot   ? ? ?Plan: Patient received an injection in the right ankle and will order a CT scan of the left hindfoot to see if there is a possible fibrous union at the calcaneocuboid fusion status post calcaneus osteotomy. ? ?Follow-Up Instructions: Return in about 3 weeks (around 03/23/2022).  ? ?Ortho Exam ? ?Patient is alert, oriented, no adenopathy, well-dressed, normal affect, normal respiratory effort. ?On examination of the right foot patient has pain to palpation in the sinus Tarsi pain at the insertion of the posterior tibial tendon.  She also has ankle pain anteriorly with a fusion.  Patient has ankle pain with dorsiflexion her foot is pronated and valgus consistent with posterior tibial tendon insufficiency.  Patient cannot do a single limb heel raise. ? ?Examination the left foot patient has a strong dorsalis pedis pulse pain is primarily over the hardware laterally there is no pain over the posterior tibial tendon.  She has some mild pain  over the sinus Tarsi. ? ?Imaging: ?No results found. ?No images are attached to the encounter. ? ?Labs: ?Lab Results  ?Component Value Date  ? HGBA1C 8.6 10/13/2021  ? ? ? ?Lab Results  ?Component Value Date  ? ALBUMIN 4.6 11/23/2021  ? ? ?No results found for: MG ?Lab Results  ?Component Value Date  ? VD25OH 31.72 10/14/2021  ? ? ?No results found for: PREALBUMIN ? ?  Latest Ref Rng & Units 11/23/2021  ? 12:00 AM  ?CBC EXTENDED  ?WBC  6.0       ?RBC 3.87 - 5.11 4.97       ?Hemoglobin 12.0 - 16.0 14.0       ?HCT 36 - 46 43       ?Platelets 150 - 399 303       ?  ? This result is from an external source.  ? ? ? ?There is no height or weight on file to calculate BMI. ? ?Orders:  ?Orders Placed This Encounter  ?Procedures  ? XR Ankle Complete Left  ? CT ANKLE LEFT WO CONTRAST  ? ?No orders of the defined types were placed in this encounter. ? ? ? Procedures: ?Medium Joint Inj: R ankle on 03/14/2022 1:07 PM ?Indications: pain and diagnostic evaluation ?Details: 22 G 1.5 in needle, anteromedial approach ?Medications: 2 mL lidocaine 1 %;  40 mg methylPREDNISolone acetate 40 MG/ML ?Outcome: tolerated well, no immediate complications ?Procedure, treatment alternatives, risks and benefits explained, specific risks discussed. Consent was given by the patient. Immediately prior to procedure a time out was called to verify the correct patient, procedure, equipment, support staff and site/side marked as required. Patient was prepped and draped in the usual sterile fashion.  ? ? ? ?Clinical Data: ?No additional findings. ? ?ROS: ? ?All other systems negative, except as noted in the HPI. ?Review of Systems ? ?Objective: ?Vital Signs: There were no vitals taken for this visit. ? ?Specialty Comments:  ?No specialty comments available. ? ?PMFS History: ?Patient Active Problem List  ? Diagnosis Date Noted  ? Controlled type 2 diabetes mellitus without complication, without long-term current use of insulin (HCC) 09/29/2021  ? Hypertension  associated with diabetes (HCC) 09/29/2021  ? Hyperlipidemia associated with type 2 diabetes mellitus (HCC) 09/29/2021  ? Morbid obesity (HCC) 09/29/2021  ? ?Past Medical History:  ?Diagnosis Date  ? Diabetes mellitus without complication (HCC)   ? Hyperlipidemia   ? Hypertension   ?  ?History reviewed. No pertinent family history.  ?Past Surgical History:  ?Procedure Laterality Date  ? ABDOMINAL HYSTERECTOMY    ? partial. fibroids  ? ANKLE SURGERY Left   ? screws to straighten  ? CHOLECYSTECTOMY    ? KNEE ARTHROSCOPY Right   ? ?Social History  ? ?Occupational History  ? Not on file  ?Tobacco Use  ? Smoking status: Never  ? Smokeless tobacco: Never  ?Vaping Use  ? Vaping Use: Every day  ? Substances: Nicotine, Flavoring  ? Devices: tried once  ?Substance and Sexual Activity  ? Alcohol use: Yes  ?  Comment: occasoinally  ? Drug use: Never  ? Sexual activity: Yes  ? ? ? ? ? ?

## 2022-03-21 ENCOUNTER — Ambulatory Visit: Payer: PRIVATE HEALTH INSURANCE | Admitting: Orthopedic Surgery

## 2022-03-27 ENCOUNTER — Other Ambulatory Visit (HOSPITAL_COMMUNITY): Payer: Self-pay

## 2022-03-27 ENCOUNTER — Ambulatory Visit
Admission: RE | Admit: 2022-03-27 | Discharge: 2022-03-27 | Disposition: A | Discharge status: Home / Self Care | Payer: PRIVATE HEALTH INSURANCE | Source: Ambulatory Visit | Attending: Orthopedic Surgery | Admitting: Orthopedic Surgery

## 2022-03-27 DIAGNOSIS — M25572 Pain in left ankle and joints of left foot: Secondary | ICD-10-CM

## 2022-03-29 ENCOUNTER — Encounter: Payer: Self-pay | Admitting: Family Medicine

## 2022-03-29 ENCOUNTER — Ambulatory Visit: Payer: PRIVATE HEALTH INSURANCE | Admitting: Family Medicine

## 2022-03-29 ENCOUNTER — Other Ambulatory Visit (HOSPITAL_COMMUNITY): Payer: Self-pay

## 2022-03-29 VITALS — BP 150/90 | HR 74 | Temp 98.0°F | Ht 66.0 in | Wt 269.2 lb

## 2022-03-29 DIAGNOSIS — I152 Hypertension secondary to endocrine disorders: Secondary | ICD-10-CM | POA: Diagnosis not present

## 2022-03-29 DIAGNOSIS — E1159 Type 2 diabetes mellitus with other circulatory complications: Secondary | ICD-10-CM

## 2022-03-29 DIAGNOSIS — E119 Type 2 diabetes mellitus without complications: Secondary | ICD-10-CM | POA: Diagnosis not present

## 2022-03-29 DIAGNOSIS — Z1211 Encounter for screening for malignant neoplasm of colon: Secondary | ICD-10-CM

## 2022-03-29 DIAGNOSIS — E785 Hyperlipidemia, unspecified: Secondary | ICD-10-CM

## 2022-03-29 DIAGNOSIS — Z1231 Encounter for screening mammogram for malignant neoplasm of breast: Secondary | ICD-10-CM

## 2022-03-29 DIAGNOSIS — E1169 Type 2 diabetes mellitus with other specified complication: Secondary | ICD-10-CM

## 2022-03-29 LAB — CBC WITH DIFFERENTIAL/PLATELET
Basophils Absolute: 0 10*3/uL (ref 0.0–0.1)
Basophils Relative: 0.7 % (ref 0.0–3.0)
Eosinophils Absolute: 0.1 10*3/uL (ref 0.0–0.7)
Eosinophils Relative: 1.7 % (ref 0.0–5.0)
HCT: 39.3 % (ref 36.0–46.0)
Hemoglobin: 12.9 g/dL (ref 12.0–15.0)
Lymphocytes Relative: 36.1 % (ref 12.0–46.0)
Lymphs Abs: 1.6 10*3/uL (ref 0.7–4.0)
MCHC: 32.9 g/dL (ref 30.0–36.0)
MCV: 84.9 fl (ref 78.0–100.0)
Monocytes Absolute: 0.3 10*3/uL (ref 0.1–1.0)
Monocytes Relative: 6.5 % (ref 3.0–12.0)
Neutro Abs: 2.5 10*3/uL (ref 1.4–7.7)
Neutrophils Relative %: 55 % (ref 43.0–77.0)
Platelets: 256 10*3/uL (ref 150.0–400.0)
RBC: 4.63 Mil/uL (ref 3.87–5.11)
RDW: 14.3 % (ref 11.5–15.5)
WBC: 4.5 10*3/uL (ref 4.0–10.5)

## 2022-03-29 LAB — COMPREHENSIVE METABOLIC PANEL
ALT: 20 U/L (ref 0–35)
AST: 18 U/L (ref 0–37)
Albumin: 4.4 g/dL (ref 3.5–5.2)
Alkaline Phosphatase: 65 U/L (ref 39–117)
BUN: 20 mg/dL (ref 6–23)
CO2: 27 mEq/L (ref 19–32)
Calcium: 10 mg/dL (ref 8.4–10.5)
Chloride: 100 mEq/L (ref 96–112)
Creatinine, Ser: 0.79 mg/dL (ref 0.40–1.20)
GFR: 86.24 mL/min (ref >60.00)
Glucose, Bld: 180 mg/dL — ABNORMAL HIGH (ref 70–99)
Potassium: 3.5 mEq/L (ref 3.5–5.1)
Sodium: 137 mEq/L (ref 135–145)
Total Bilirubin: 0.3 mg/dL (ref 0.2–1.2)
Total Protein: 7.9 g/dL (ref 6.0–8.3)

## 2022-03-29 LAB — HEMOGLOBIN A1C: Hgb A1c MFr Bld: 7.8 % — ABNORMAL HIGH (ref 4.6–6.5)

## 2022-03-29 LAB — LIPID PANEL
Cholesterol: 213 mg/dL — ABNORMAL HIGH (ref 0–200)
HDL: 55.4 mg/dL (ref >39.00)
LDL Cholesterol: 130 mg/dL — ABNORMAL HIGH (ref 0–99)
NonHDL: 157.19
Total CHOL/HDL Ratio: 4
Triglycerides: 135 mg/dL (ref 0.0–149.0)
VLDL: 27 mg/dL (ref 0.0–40.0)

## 2022-03-29 LAB — TSH: TSH: 2.7 u[IU]/mL (ref 0.35–5.50)

## 2022-03-29 LAB — MICROALBUMIN / CREATININE URINE RATIO
Creatinine,U: 62.4 mg/dL
Microalb Creat Ratio: 1.1 mg/g (ref 0.0–30.0)
Microalb, Ur: <0.7 mg/dL (ref 0.0–1.9)

## 2022-03-29 MED ORDER — AMLODIPINE BESYLATE 10 MG PO TABS
10.0000 mg | ORAL_TABLET | Freq: Every day | ORAL | 3 refills | Status: DC
  Filled 2022-03-29: qty 30, 30d supply, fill #0
  Filled 2022-04-21: qty 30, 30d supply, fill #1
  Filled 2022-05-21: qty 30, 30d supply, fill #2
  Filled 2022-06-20: qty 30, 30d supply, fill #3
  Filled 2022-07-17: qty 30, 30d supply, fill #4
  Filled 2022-08-19: qty 30, 30d supply, fill #5
  Filled 2022-09-18: qty 30, 30d supply, fill #6
  Filled 2022-10-23: qty 30, 30d supply, fill #7
  Filled 2022-11-17 – 2022-12-12 (×7): qty 30, 30d supply, fill #8

## 2022-03-29 MED ORDER — PEN NEEDLES 32G X 5 MM MISC
1.0000 | 1 refills | Status: DC
  Filled 2022-03-29: qty 50, fill #0

## 2022-03-29 MED ORDER — INSULIN PEN NEEDLE 31G X 5 MM MISC
1.0000 | 1 refills | Status: DC
  Filled 2022-03-29: qty 100, 30d supply, fill #0

## 2022-03-29 MED ORDER — OZEMPIC (0.25 OR 0.5 MG/DOSE) 2 MG/3ML ~~LOC~~ SOPN
PEN_INJECTOR | SUBCUTANEOUS | 0 refills | Status: DC
  Filled 2022-03-29: qty 3, 56d supply, fill #0
  Filled 2022-04-04 – 2022-04-06 (×2): qty 3, 28d supply, fill #0
  Filled 2022-04-11: qty 3, 56d supply, fill #0
  Filled 2022-04-21: qty 3, 30d supply, fill #0

## 2022-03-29 NOTE — Patient Instructions (Signed)
It was very nice to see you today! ? ?[place] [phone] for mammogram  ?GI will call for colon ? ?Increase amlodipine to 10mg .  Monitor blood pressures ?Get eye exam scheduled ? ? ?PLEASE NOTE: ? ?If you had any lab tests please let us know if you have not heard back within a few days. You may see your results on MyChart before we have a chance to review them but we will give you a call once they are reviewed by [place]. If we ordered any referrals today, please let us know if you have not heard from their office within the next week.  ? ?Please try these tips to maintain a healthy lifestyle: ? ?Eat most of your calories during the day when you are active. Eliminate processed foods including packaged sweets (pies, cakes, cookies), reduce intake of potatoes, white bread, white pasta, and white rice. Look for whole grain options, oat flour or almond flour. ? ?Each meal should contain half fruits/vegetables, one quarter protein, and one quarter carbs (no bigger than a computer mouse). ? ?Cut down on sweet beverages. This includes juice, soda, and sweet tea. Also watch fruit intake, though this is a healthier sweet option, it still contains natural sugar! Limit to 3 servings daily. ? ?Drink at least 1 glass of water with each meal and aim for at least 8 glasses per day ? ?Exercise at least 150 minutes every week.   ?

## 2022-03-29 NOTE — Addendum Note (Signed)
Addended by: Wellington Hampshire on: 03/29/2022 10:52 AM ? ? Modules accepted: Orders ? ?

## 2022-03-29 NOTE — Progress Notes (Signed)
? ?Subjective:  ? ? ? Patient ID: Stephanie Mccall, female    DOB: 04-24-1971, 51 y.o.   MRN: [id number] ? ?Chief Complaint  ?Patient presents with  ? Follow-up  ?  3 month follow-up ?Fasting ? ?  ? ? ?HPI ? DM type 2-taking metformin bid and glipizide 2.5mg .  on ARB,statin.  130-150's. Trulicity when in [place] but then no ins.  Ophth overdue.  ?HTN-on amlodipine,lorsartan/hct.  Not checking.  Past 3 wks, HA daily-some nausea.  Smell of beef can make nausea.  No cp/edema/cough/sob ?HLD-on pravastatin 10mg -doing well ?4.  L ankle pain-seeing ortho-CT done.  Has appt tomorrow ? ?Health Maintenance Due  ?Topic Date Due  ? FOOT EXAM  Never done  ? OPHTHALMOLOGY EXAM  Never done  ? COLONOSCOPY (Pts 45-40yrs Insurance coverage will need to be confirmed)  Never done  ? MAMMOGRAM  Never done  ? ? ?Past Medical History:  ?Diagnosis Date  ? Diabetes mellitus without complication ([place])   ? Hyperlipidemia   ? Hypertension   ? ? ?Past Surgical History:  ?Procedure Laterality Date  ? ABDOMINAL HYSTERECTOMY    ? partial. fibroids  ? ANKLE SURGERY Left   ? screws to straighten  ? CHOLECYSTECTOMY    ? KNEE ARTHROSCOPY Right   ? ? ?Outpatient Medications Prior to Visit  ?Medication Sig Dispense Refill  ? Accu-Chek Softclix Lancets lancets Test up to twice daily as directed in the morning fasting and when hypoglycemic 100 each 1  ? aspirin 81 MG EC tablet Take 1 tablet (81 mg total) by mouth daily. 30 tablet 3  ? Blood Glucose Monitoring Suppl (BLOOD GLUCOSE MONITOR SYSTEM) w/Device KIT Test up to twice daily as directed in the morning fasting and when hypoglycemic 1 kit 0  ? cyclobenzaprine (FLEXERIL) 10 MG tablet Take 1 tablet (10 mg total) by mouth at bedtime. 30 tablet 5  ? glipiZIDE (GLUCOTROL XL) 2.5 MG 24 hr tablet Take 1 tablet (2.5 mg total) by mouth daily. 90 tablet 1  ? glucose blood test strip Test up to twice daily as directed in the morning fasting and when hypoglycemic 100 each 1  ? losartan-hydrochlorothiazide (HYZAAR)  100-25 MG tablet Take 1 tablet by mouth daily. 90 tablet 3  ? metFORMIN (GLUCOPHAGE) 1000 MG tablet Take 1 tablet (1,000 mg total) by mouth 2 (two) times daily. 180 tablet 1  ? pravastatin (PRAVACHOL) 10 MG tablet Take 1 tablet (10 mg total) by mouth daily. 90 tablet 3  ? amLODipine (NORVASC) 5 MG tablet Take 1 tablet (5 mg total) by mouth daily. 90 tablet 1  ? ?No facility-administered medications prior to visit.  ? ? ?No Known Allergies ?ROS  ?ROS: ?Gen: no fever, chills  ?Skin: no rash, itching ?ENT: no ear pain, ear drainage, nasal congestion, rhinorrhea, sinus pressure, sore throat ?Eyes: no blurry vision, double vision ?Resp: no cough, wheeze,SOB ?CV: no CP, palpitations, LE edema,  ?GI: no heartburn, d/c, abd pain.  Occ vomits.  Smells of beef and eggs can make nauea ?GU: no dysuria, urgency, frequency, hematuria.  Vag irrit.no itching.  Just "discomfort"  ?MSK: L ankle.  A lot of myalgias/joint pains ?Neuro: no dizziness, headache, weakness, vertigo ?Psych: no depression, anxiety, insomnia, SI  ?Intermitt painful "boil" abd-pain.  No d/c then resolve.  ? ?   ?Objective:  ?  ? ?BP (!) 150/90   Pulse 74   Temp 98 ?F (36.7 ?C) (Temporal)   Ht 5\' 6"  (1.676 m)   Wt 269 lb  4 oz (122.1 kg)   SpO2 96%   BMI 43.46 kg/m?  ?Wt Readings from Last 3 Encounters:  ?03/29/22 269 lb 4 oz (122.1 kg)  ?12/30/21 259 lb 8 oz (117.7 kg)  ?09/29/21 250 lb (113.4 kg)  ? ? ?Physical Exam 160/96 ? ?Gen: WDWN NAD MO ?HEENT: NCAT, conjunctiva not injected, sclera nonicteric ?NECK:  supple, no thyromegaly, no nodes, no carotid bruits ?CARDIAC: RRR, S1S2+, no murmur. DP 2+B ?LUNGS: CTAB. No wheezes ?ABDOMEN:  BS+, soft, NTND, No HSM, no masses ?EXT:  no edema ?MSK: ankle brace R ?NEURO: A&O x3.  CN II-XII intact.  ?PSYCH: normal mood. Good eye contact ? ?   ?Assessment & Plan:  ? ?Problem List Items Addressed This Visit   ? ?  ? Cardiovascular and Mediastinum  ? Hypertension associated with diabetes ([place])  ? Relevant Medications  ?  amLODipine (NORVASC) 10 MG tablet  ? Semaglutide,0.25 or 0.5MG /DOS, (OZEMPIC, 0.25 OR 0.5 MG/DOSE,) 2 MG/3ML SOPN  ? Other Relevant Orders  ? Comprehensive metabolic panel  ? Lipid panel  ? TSH  ? CBC with Differential/Platelet  ? Microalbumin / creatinine urine ratio  ?  ? Endocrine  ? Controlled type 2 diabetes mellitus without complication, without long-term current use of insulin ([place]) - Primary  ? Relevant Medications  ? Semaglutide,0.25 or 0.5MG /DOS, (OZEMPIC, 0.25 OR 0.5 MG/DOSE,) 2 MG/3ML SOPN  ? Other Relevant Orders  ? Comprehensive metabolic panel  ? Hemoglobin A1c  ? Lipid panel  ? TSH  ? CBC with Differential/Platelet  ? Microalbumin / creatinine urine ratio  ? Hyperlipidemia associated with type 2 diabetes mellitus ([place])  ? Relevant Medications  ? amLODipine (NORVASC) 10 MG tablet  ? Semaglutide,0.25 or 0.5MG /DOS, (OZEMPIC, 0.25 OR 0.5 MG/DOSE,) 2 MG/3ML SOPN  ?  ? Other  ? Morbid obesity ([place])  ? Relevant Medications  ? Semaglutide,0.25 or 0.5MG /DOS, (OZEMPIC, 0.25 OR 0.5 MG/DOSE,) 2 MG/3ML SOPN  ? ?Other Visit Diagnoses   ? ? Encounter for screening mammogram for malignant neoplasm of breast      ? Relevant Orders  ? MM Digital Screening  ? Screen for colon cancer      ? Relevant Orders  ? Ambulatory referral to Gastroenterology  ? ?  ? DM type 2.chronic.  controlled.  Cont metformin 1000 bid and glipizide 2.5.  add ozempic as pt cont to gain wt.  Work on [employer].  Check cbc,cmp,a1C, microalb creat.  Get ophth sch.  F/u 3 mo ?HTN-chronic, uncontrolled.  Cont losartan/hct 100/25.  Increase amlodipine to 10mg .  Monitor bp's.  Call if edema or not controlled.  Check labs.   ?HLD-chronic.  Not controlled.  Taking pravachol 10mg .  Check lipids.  May need to increase. ?Morbid obesity  disc TLC.  Will start ozempic ?Pt will call pharm in [place] to get immunization records. ? ?Meds ordered this encounter  ?Medications  ? amLODipine (NORVASC) 10 MG tablet  ?  Sig: Take 1 tablet (10 mg total) by mouth daily.  ?   Dispense:  90 tablet  ?  Refill:  3  ? Semaglutide,0.25 or 0.5MG /DOS, (OZEMPIC, 0.25 OR 0.5 MG/DOSE,) 2 MG/3ML SOPN  ?  Sig: Inject 0.25 mg into the skin once a week for 28 days, THEN 0.5 mg once a week for 28 days.  ?  Dispense:  3 mL  ?  Refill:  0  ? Insulin Pen Needle (PEN NEEDLES) 32G X 5 MM MISC  ?  Sig: 1 each by Does not  apply route once a week.  ?  Dispense:  50 each  ?  Refill:  1  ? ? ?Wellington Hampshire, MD ? ?

## 2022-03-30 ENCOUNTER — Ambulatory Visit: Payer: PRIVATE HEALTH INSURANCE | Admitting: Orthopedic Surgery

## 2022-03-30 ENCOUNTER — Encounter: Payer: Self-pay | Admitting: Orthopedic Surgery

## 2022-03-30 DIAGNOSIS — M76821 Posterior tibial tendinitis, right leg: Secondary | ICD-10-CM | POA: Diagnosis not present

## 2022-03-30 DIAGNOSIS — M25572 Pain in left ankle and joints of left foot: Secondary | ICD-10-CM

## 2022-03-30 NOTE — Progress Notes (Signed)
Office Visit Note   Patient: Stephanie Mccall           Date of Birth: May 04, 1971           MRN: [id number] Visit Date: 03/30/2022              Requested by: Jeani Sow, MD [address] [place],  [place] [address] PCP: Jeani Sow, MD  Chief Complaint  Patient presents with   Left Ankle - Follow-up    CT scan review       HPI: Patient is a 51 year old woman who presents in follow-up for her left foot and ankle status post CT scan.  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  Assessment & Plan: Visit Diagnoses:  1. Pain in left ankle and joints of left foot     Plan: Patient would like to consider surgical intervention in November.  She would like to consider surgery on the left foot first which would be to remove the deep retained hardware laterally over the calcaneus and fusion of the calcaneocuboid joint.  Patient has multiple incisions over the hindfoot discussed risk of the wound not healing infection need for additional surgery.  Patient will follow-up in 3 months to reevaluate for surgery.  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  Follow-Up Instructions: Return in about 3 months (around 06/30/2022).   Ortho Exam  Patient is alert, oriented, no adenopathy, well-dressed, normal affect, normal respiratory effort. Examination patient had no relief with the injection to the left ankle.  Patient has good dorsalis pedis pulse bilaterally.  On the right foot she has pronation and valgus with posterior tibial tendon insufficiency unable to do a single limb heel raise.  Review of the CT scan shows a previous calcaneal osteotomy with the failed hardware.  Patient has degenerative changes of the calcaneocuboid joint is point tender to palpation over the calcaneocuboid joint.  Imaging: No  results found. No images are attached to the encounter.  Labs: Lab Results  Component Value Date   HGBA1C 7.8 (H) 03/29/2022   HGBA1C 8.6 10/13/2021     Lab Results  Component Value Date   ALBUMIN 4.4 03/29/2022   ALBUMIN 4.6 11/23/2021    No results found for: MG Lab Results  Component Value Date   VD25OH 31.72 10/14/2021    No results found for: PREALBUMIN    Latest Ref Rng & Units 03/29/2022    9:09 AM 11/23/2021   12:00 AM  CBC EXTENDED  WBC 4.0 - 10.5 K/uL 4.5   6.0       RBC 3.87 - 5.11 Mil/uL 4.63   4.97       Hemoglobin 12.0 - 15.0 g/dL 35.0   09.3       HCT 81.8 - 46.0 % 39.3   43       Platelets 150.0 - 400.0 K/uL 256.0   303       NEUT# 1.4 - 7.7 K/uL 2.5     Lymph# 0.7 - 4.0 K/uL 1.6        This result is from an external source.     There is no height or weight on file to calculate BMI.  Orders:  No orders of the defined types were placed in this encounter.  No orders of the defined types were placed in this encounter.  Procedures: No procedures performed  Clinical Data: No additional findings.  ROS:  All other systems negative, except as noted in the HPI. Review of Systems  Objective: Vital Signs: There were no vitals taken for this visit.  Specialty Comments:  No specialty comments available.  PMFS History: Patient Active Problem List   Diagnosis Date Noted   Controlled type 2 diabetes mellitus without complication, without long-term current use of insulin (HCC) 09/29/2021   Hypertension associated with diabetes (HCC) 09/29/2021   Hyperlipidemia associated with type 2 diabetes mellitus (HCC) 09/29/2021   Morbid obesity (HCC) 09/29/2021   Past Medical History:  Diagnosis Date   Diabetes mellitus without complication (HCC)    Hyperlipidemia    Hypertension     History reviewed. No pertinent family history.  Past Surgical History:  Procedure Laterality Date   ABDOMINAL HYSTERECTOMY     partial. fibroids   ANKLE SURGERY  Left    screws to straighten   CHOLECYSTECTOMY     KNEE ARTHROSCOPY Right    Social History   Occupational History   Not on file  Tobacco Use   Smoking status: Never   Smokeless tobacco: Never  Vaping Use   Vaping Use: Every day   Substances: Nicotine, Flavoring   Devices: tried once  Substance and Sexual Activity   Alcohol use: Yes    Comment: occasoinally   Drug use: Never   Sexual activity: Yes

## 2022-04-04 ENCOUNTER — Ambulatory Visit
Admission: RE | Admit: 2022-04-04 | Discharge: 2022-04-04 | Disposition: A | Discharge status: Home / Self Care | Payer: PRIVATE HEALTH INSURANCE | Source: Ambulatory Visit | Attending: Family Medicine | Admitting: Family Medicine

## 2022-04-04 ENCOUNTER — Other Ambulatory Visit (HOSPITAL_COMMUNITY): Payer: Self-pay

## 2022-04-04 DIAGNOSIS — Z1231 Encounter for screening mammogram for malignant neoplasm of breast: Secondary | ICD-10-CM

## 2022-04-05 ENCOUNTER — Emergency Department (HOSPITAL_COMMUNITY): Payer: PRIVATE HEALTH INSURANCE

## 2022-04-05 ENCOUNTER — Emergency Department (HOSPITAL_COMMUNITY)
Admission: EM | Admit: 2022-04-05 | Discharge: 2022-04-05 | Disposition: A | Discharge status: Home / Self Care | Payer: PRIVATE HEALTH INSURANCE | Attending: Emergency Medicine | Admitting: Emergency Medicine

## 2022-04-05 ENCOUNTER — Encounter: Payer: Self-pay | Admitting: Family Medicine

## 2022-04-05 ENCOUNTER — Encounter (HOSPITAL_COMMUNITY): Payer: Self-pay

## 2022-04-05 ENCOUNTER — Other Ambulatory Visit: Payer: Self-pay

## 2022-04-05 DIAGNOSIS — Z79899 Other long term (current) drug therapy: Secondary | ICD-10-CM | POA: Diagnosis not present

## 2022-04-05 DIAGNOSIS — Z7984 Long term (current) use of oral hypoglycemic drugs: Secondary | ICD-10-CM | POA: Diagnosis not present

## 2022-04-05 DIAGNOSIS — R079 Chest pain, unspecified: Secondary | ICD-10-CM

## 2022-04-05 DIAGNOSIS — R519 Headache, unspecified: Secondary | ICD-10-CM | POA: Diagnosis present

## 2022-04-05 DIAGNOSIS — I10 Essential (primary) hypertension: Secondary | ICD-10-CM | POA: Insufficient documentation

## 2022-04-05 DIAGNOSIS — Z7982 Long term (current) use of aspirin: Secondary | ICD-10-CM | POA: Diagnosis not present

## 2022-04-05 LAB — BASIC METABOLIC PANEL
Anion gap: 11 (ref 5–15)
BUN: 16 mg/dL (ref 6–20)
CO2: 23 mmol/L (ref 22–32)
Calcium: 9.8 mg/dL (ref 8.9–10.3)
Chloride: 102 mmol/L (ref 98–111)
Creatinine, Ser: 0.69 mg/dL (ref 0.44–1.00)
GFR, Estimated: >60 mL/min (ref >60)
Glucose, Bld: 187 mg/dL — ABNORMAL HIGH (ref 70–99)
Potassium: 3.5 mmol/L (ref 3.5–5.1)
Sodium: 136 mmol/L (ref 135–145)

## 2022-04-05 LAB — CBC
HCT: 45.2 % (ref 36.0–46.0)
Hemoglobin: 14 g/dL (ref 12.0–15.0)
MCH: 27 pg (ref 26.0–34.0)
MCHC: 31 g/dL (ref 30.0–36.0)
MCV: 87.3 fL (ref 80.0–100.0)
Platelets: 259 10*3/uL (ref 150–400)
RBC: 5.18 MIL/uL — ABNORMAL HIGH (ref 3.87–5.11)
RDW: 14.3 % (ref 11.5–15.5)
WBC: 4.6 10*3/uL (ref 4.0–10.5)
nRBC: 0 % (ref 0.0–0.2)

## 2022-04-05 LAB — HM MAMMOGRAPHY

## 2022-04-05 LAB — I-STAT BETA HCG BLOOD, ED (MC, WL, AP ONLY): I-stat hCG, quantitative: <5 m[IU]/mL (ref <5)

## 2022-04-05 LAB — TROPONIN I (HIGH SENSITIVITY)
Troponin I (High Sensitivity): 5 ng/L (ref <18)
Troponin I (High Sensitivity): 4 ng/L (ref <18)

## 2022-04-05 MED ORDER — KETOROLAC TROMETHAMINE 15 MG/ML IJ SOLN
15.0000 mg | Freq: Once | INTRAMUSCULAR | Status: AC
  Administered 2022-04-05: 15 mg via INTRAVENOUS
  Filled 2022-04-05: qty 1

## 2022-04-05 NOTE — ED Provider Notes (Signed)
[facility] EMERGENCY DEPARTMENT Provider Note   CSN: [id number] Arrival date & time: 04/05/22  4098     History  Chief Complaint  Patient presents with   Chest Pain   Headache    Stephanie Mccall is a 51 y.o. female.  Presenting to the emergency department due to concern for chest pain, headache, elevated blood pressure.  Patient states that this morning she noted headache, noted some chest discomfort.  Headache is frontal, moderate, nonradiating.  Chest pain is described as tightness, grabbing sensation, episode was brief, not associated with exertion.  Has currently resolved, no ongoing chest discomfort at this time.  No associated difficulty breathing, back pain.  No numbness, weakness, speech or vision change.  No chills or fevers.  States that while she was at her work, her blood pressure was checked and noted to be elevated.  HPI     Home Medications Prior to Admission medications   Medication Sig Start Date End Date Taking? Authorizing Provider  Accu-Chek Softclix Lancets lancets Test up to twice daily as directed in the morning fasting and when hypoglycemic 10/27/21     amLODipine (NORVASC) 10 MG tablet Take 1 tablet (10 mg total) by mouth daily. 03/29/22   Tawnya Crook, MD  aspirin 81 MG EC tablet Take 1 tablet (81 mg total) by mouth daily. 10/13/21     Blood Glucose Monitoring Suppl (BLOOD GLUCOSE MONITOR SYSTEM) w/Device KIT Test up to twice daily as directed in the morning fasting and when hypoglycemic 10/27/21     cyclobenzaprine (FLEXERIL) 10 MG tablet Take 1 tablet (10 mg total) by mouth at bedtime. 12/30/21   Tawnya Crook, MD  glipiZIDE (GLUCOTROL XL) 2.5 MG 24 hr tablet Take 1 tablet (2.5 mg total) by mouth daily. 12/30/21   Tawnya Crook, MD  glucose blood test strip Test up to twice daily as directed in the morning fasting and when hypoglycemic 10/27/21     losartan-hydrochlorothiazide (HYZAAR) 100-25 MG tablet Take 1 tablet by mouth daily.  12/30/21   Tawnya Crook, MD  metFORMIN (GLUCOPHAGE) 1000 MG tablet Take 1 tablet (1,000 mg total) by mouth 2 (two) times daily. 12/30/21   Tawnya Crook, MD  pravastatin (PRAVACHOL) 10 MG tablet Take 1 tablet (10 mg total) by mouth daily. 12/30/21   Tawnya Crook, MD  Semaglutide,0.25 or 0.5MG /DOS, (OZEMPIC, 0.25 OR 0.5 MG/DOSE,) 2 MG/3ML SOPN Inject 0.25 mg into the skin once a week for 28 days, THEN 0.5 mg once a week for 28 days. 03/29/22 05/24/22  Tawnya Crook, MD  diltiazem (DILACOR XR) 120 MG 24 hr capsule Take 1 capsule (120 mg total) by mouth daily. Take along with 240mg  dose to equal 360mg  daily 10/13/21 10/13/21        Allergies    Nitroglycerin    Review of Systems   Review of Systems  Constitutional:  Negative for chills, fatigue and fever.  HENT:  Negative for ear pain and sore throat.   Eyes:  Negative for pain and visual disturbance.  Respiratory:  Negative for cough and shortness of breath.   Cardiovascular:  Positive for chest pain. Negative for palpitations.  Gastrointestinal:  Negative for abdominal pain and vomiting.  Genitourinary:  Negative for dysuria and hematuria.  Musculoskeletal:  Negative for arthralgias and back pain.  Skin:  Negative for color change and rash.  Neurological:  Positive for headaches. Negative for seizures and syncope.  All other systems reviewed and are negative.  Physical Exam Updated Vital Signs BP (!) 147/78   Pulse 63   Temp 98.5 F (36.9 C) (Oral)   Resp 14   Ht 5\' 6"  (1.676 m)   Wt 122 kg   SpO2 100%   BMI 43.42 kg/m  Physical Exam Vitals and nursing note reviewed.  Constitutional:      General: She is not in acute distress.    Appearance: She is well-developed.  HENT:     Head: Normocephalic and atraumatic.  Eyes:     Conjunctiva/sclera: Conjunctivae normal.  Cardiovascular:     Rate and Rhythm: Normal rate and regular rhythm.     Heart sounds: No murmur heard. Pulmonary:     Effort: Pulmonary effort is  normal. No respiratory distress.     Breath sounds: Normal breath sounds.  Abdominal:     Palpations: Abdomen is soft.     Tenderness: There is no abdominal tenderness.  Musculoskeletal:        General: No swelling.     Cervical back: Neck supple.     Right lower leg: No edema.     Left lower leg: No edema.  Skin:    General: Skin is warm and dry.     Capillary Refill: Capillary refill takes less than 2 seconds.  Neurological:     Mental Status: She is alert.  Psychiatric:        Mood and Affect: Mood normal.    ED Results / Procedures / Treatments   Labs (all labs ordered are listed, but only abnormal results are displayed) Labs Reviewed  BASIC METABOLIC PANEL - Abnormal; Notable for the following components:      Result Value   Glucose, Bld 187 (*)    All other components within normal limits  CBC - Abnormal; Notable for the following components:   RBC 5.18 (*)    All other components within normal limits  I-STAT BETA HCG BLOOD, ED ([facility], [facility], [facility] ONLY)  TROPONIN I (HIGH SENSITIVITY)  TROPONIN I (HIGH SENSITIVITY)    EKG EKG Interpretation  Date/Time:  Wednesday Apr 05 2022 09:16:23 EDT Ventricular Rate:  80 PR Interval:  150 QRS Duration: 86 QT Interval:  428 QTC Calculation: 493 R Axis:   66 Text Interpretation: Normal sinus rhythm Prolonged QT Abnormal ECG No previous ECGs available Confirmed by [id number] (Marianna Fuss) on 04/05/2022 9:18:53 AM  Radiology DG Chest 2 View  Result Date: 04/05/2022 CLINICAL DATA:  Chest pain. EXAM: CHEST - 2 VIEW COMPARISON:  None Available. FINDINGS: Heart size and mediastinal contours are normal. No pleural effusion or edema. No airspace opacities identified. Visualized osseous structures appear grossly intact. IMPRESSION: No active cardiopulmonary abnormalities. Electronically Signed   By: 04/07/2022 M.D.   On: 04/05/2022 09:52   CT Head Wo Contrast  Result Date: 04/05/2022 CLINICAL DATA:  Headache EXAM: CT HEAD WITHOUT  CONTRAST TECHNIQUE: Contiguous axial images were obtained from the base of the skull through the vertex without intravenous contrast. RADIATION DOSE REDUCTION: This exam was performed according to the departmental dose-optimization program which includes automated exposure control, adjustment of the mA and/or kV according to patient size and/or use of iterative reconstruction technique. COMPARISON:  None Available. FINDINGS: Brain: No evidence of acute infarction, hemorrhage, hydrocephalus, extra-axial collection or mass lesion/mass effect. Vascular: Negative for hyperdense vessel Skull: Negative Sinuses/Orbits: Paranasal sinuses clear.  Negative orbit Other: None IMPRESSION: Negative CT head Electronically Signed   By: 04/07/2022 M.D.   On: 04/05/2022 11:04  MM 3D SCREEN BREAST BILATERAL  Result Date: 04/05/2022 CLINICAL DATA:  Screening. EXAM: DIGITAL SCREENING BILATERAL MAMMOGRAM WITH TOMOSYNTHESIS AND CAD TECHNIQUE: Bilateral screening digital craniocaudal and mediolateral oblique mammograms were obtained. Bilateral screening digital breast tomosynthesis was performed. The images were evaluated with computer-aided detection. COMPARISON:  None available. ACR Breast Density Category b: There are scattered areas of fibroglandular density. FINDINGS: There are no findings suspicious for malignancy. IMPRESSION: No mammographic evidence of malignancy. A result letter of this screening mammogram will be mailed directly to the patient. RECOMMENDATION: Screening mammogram in one year. (Code:SM-B-01Y) BI-RADS CATEGORY  1: Negative. Electronically Signed   By: Lillia Mountain M.D.   On: 04/05/2022 08:29    Procedures Procedures    Medications Ordered in ED Medications  ketorolac (TORADOL) 15 MG/ML injection 15 mg (15 mg Intravenous Given 04/05/22 1021)    ED Course/ Medical Decision Making/ A&P                           Medical Decision Making Amount and/or Complexity of Data Reviewed Labs:  ordered. Radiology: ordered.  Risk Prescription drug management.   51 year old lady presenting to ER due to concern for headache, chest pain, high blood pressure.  On exam she was well-appearing in no acute distress.  Vitals reviewed, noted hypertension but otherwise stable.  Regarding the episode of chest discomfort, brief, tightness sensation, no ongoing pain.  EKG without ischemic changes.  Troponin x2 within normal limits, doubt ACS.  Regarding headache, check CT head, no acute intracranial findings noted.  I independently reviewed and interpreted CT results, no evidence for [facility], stroke.  On reassessment, headache had resolved with Toradol.  Patient denies any ongoing symptoms at this time, without any BP intervention her blood pressure is now in much more reasonable range, 193 systolic.  We will go ahead and discharge patient home and I advised her to follow-up with her primary care doctor to discuss her symptoms and blood pressure management further.  After the discussed management above, the patient was determined to be safe for discharge.  The patient was in agreement with this plan and all questions regarding their care were answered.  ED return precautions were discussed and the patient will return to the ED with any significant worsening of condition.         Final Clinical Impression(s) / ED Diagnoses Final diagnoses:  Hypertension, unspecified type  Bad headache  Chest pain, unspecified type    Rx / DC Orders ED Discharge Orders     None         Lucrezia Starch, MD 04/05/22 1402

## 2022-04-05 NOTE — Discharge Instructions (Signed)
Follow-up with your primary care doctor to discuss your symptoms from today as well as management of your high blood pressure.  Come back here if you are having uncontrolled headache, vomiting, chest pain, difficulty breathing or other new concerning symptom.

## 2022-04-05 NOTE — ED Triage Notes (Signed)
Pt arrived POV from home c/o left sided chest tightness and a headache that started when she woke up this morning. Pt states she has a hx of hypertension.

## 2022-04-05 NOTE — ED Notes (Signed)
Patient transported to CT 

## 2022-04-05 NOTE — ED Notes (Signed)
Patient transported to x-ray. ?

## 2022-04-06 ENCOUNTER — Other Ambulatory Visit (HOSPITAL_COMMUNITY): Payer: Self-pay

## 2022-04-11 ENCOUNTER — Other Ambulatory Visit (HOSPITAL_COMMUNITY): Payer: Self-pay

## 2022-04-11 ENCOUNTER — Telehealth: Payer: Self-pay

## 2022-04-11 NOTE — Telephone Encounter (Signed)
Form was faxed to office on 04/06/2022, form placed on providers desk for signature. Will be faxed back once provider sign. Patient notified.

## 2022-04-11 NOTE — Telephone Encounter (Signed)
Completed form and office notes faxed back to [facility] on 04/11/2022.

## 2022-04-11 NOTE — Telephone Encounter (Signed)
Patient requesting call back.  States Ozempic is requesting PA to be processed.  Patient uses [facility].

## 2022-04-21 ENCOUNTER — Other Ambulatory Visit (HOSPITAL_COMMUNITY): Payer: Self-pay

## 2022-05-12 ENCOUNTER — Other Ambulatory Visit (HOSPITAL_COMMUNITY): Payer: Self-pay

## 2022-05-21 ENCOUNTER — Encounter: Payer: Self-pay | Admitting: Family Medicine

## 2022-05-21 ENCOUNTER — Other Ambulatory Visit: Payer: Self-pay | Admitting: Family Medicine

## 2022-05-21 DIAGNOSIS — Z1211 Encounter for screening for malignant neoplasm of colon: Secondary | ICD-10-CM

## 2022-05-21 DIAGNOSIS — E119 Type 2 diabetes mellitus without complications: Secondary | ICD-10-CM

## 2022-05-22 ENCOUNTER — Other Ambulatory Visit (HOSPITAL_COMMUNITY): Payer: Self-pay

## 2022-05-22 MED ORDER — OZEMPIC (0.25 OR 0.5 MG/DOSE) 2 MG/3ML ~~LOC~~ SOPN
PEN_INJECTOR | SUBCUTANEOUS | 0 refills | Status: DC
  Filled 2022-05-22: qty 3, 28d supply, fill #0

## 2022-06-10 ENCOUNTER — Other Ambulatory Visit (HOSPITAL_COMMUNITY): Payer: Self-pay

## 2022-06-12 ENCOUNTER — Other Ambulatory Visit (HOSPITAL_COMMUNITY): Payer: Self-pay

## 2022-06-16 ENCOUNTER — Encounter (HOSPITAL_BASED_OUTPATIENT_CLINIC_OR_DEPARTMENT_OTHER): Payer: Self-pay | Admitting: Emergency Medicine

## 2022-06-16 ENCOUNTER — Emergency Department (HOSPITAL_BASED_OUTPATIENT_CLINIC_OR_DEPARTMENT_OTHER): Payer: Worker's Compensation | Admitting: Radiology

## 2022-06-16 ENCOUNTER — Emergency Department (HOSPITAL_BASED_OUTPATIENT_CLINIC_OR_DEPARTMENT_OTHER)
Admission: EM | Admit: 2022-06-16 | Discharge: 2022-06-16 | Disposition: A | Discharge status: Home / Self Care | Payer: Worker's Compensation | Attending: Emergency Medicine | Admitting: Emergency Medicine

## 2022-06-16 DIAGNOSIS — W1840XA Slipping, tripping and stumbling without falling, unspecified, initial encounter: Secondary | ICD-10-CM | POA: Diagnosis not present

## 2022-06-16 DIAGNOSIS — S93402A Sprain of unspecified ligament of left ankle, initial encounter: Secondary | ICD-10-CM

## 2022-06-16 DIAGNOSIS — M25572 Pain in left ankle and joints of left foot: Secondary | ICD-10-CM | POA: Diagnosis present

## 2022-06-16 DIAGNOSIS — E119 Type 2 diabetes mellitus without complications: Secondary | ICD-10-CM | POA: Insufficient documentation

## 2022-06-16 DIAGNOSIS — Y990 Civilian activity done for income or pay: Secondary | ICD-10-CM | POA: Diagnosis not present

## 2022-06-16 DIAGNOSIS — Z7982 Long term (current) use of aspirin: Secondary | ICD-10-CM | POA: Insufficient documentation

## 2022-06-16 DIAGNOSIS — Z79899 Other long term (current) drug therapy: Secondary | ICD-10-CM | POA: Diagnosis not present

## 2022-06-16 DIAGNOSIS — Z794 Long term (current) use of insulin: Secondary | ICD-10-CM | POA: Insufficient documentation

## 2022-06-16 DIAGNOSIS — Z7984 Long term (current) use of oral hypoglycemic drugs: Secondary | ICD-10-CM | POA: Insufficient documentation

## 2022-06-16 DIAGNOSIS — I10 Essential (primary) hypertension: Secondary | ICD-10-CM | POA: Insufficient documentation

## 2022-06-16 NOTE — ED Provider Notes (Signed)
[facility] EMERGENCY DEPT Provider Note   CSN: [id number] Arrival date & time: 06/16/22  9449     History  Chief Complaint  Patient presents with   Ankle Pain    Stephanie Mccall is a 51 y.o. female.  Patient is a 51 year old female with past medical history of hypertension, type 2 diabetes, and prior left ankle surgery.  Patient presents today with complaints of ankle pain.  She reports slipping on a wet floor at work and turning her ankle.  She describes some discomfort with ambulation.  No alleviating factors.  The history is provided by the patient.       Home Medications Prior to Admission medications   Medication Sig Start Date End Date Taking? Authorizing Provider  Accu-Chek Softclix Lancets lancets Test up to twice daily as directed in the morning fasting and when hypoglycemic 10/27/21     amLODipine (NORVASC) 10 MG tablet Take 1 tablet (10 mg total) by mouth daily. 03/29/22   Tawnya Crook, MD  aspirin EC 81 MG tablet Take 1 tablet (81 mg total) by mouth daily. 10/13/21     Blood Glucose Monitoring Suppl (BLOOD GLUCOSE MONITOR SYSTEM) w/Device KIT Test up to twice daily as directed in the morning fasting and when hypoglycemic 10/27/21     cyclobenzaprine (FLEXERIL) 10 MG tablet Take 1 tablet (10 mg total) by mouth at bedtime. 12/30/21   Tawnya Crook, MD  glipiZIDE (GLUCOTROL XL) 2.5 MG 24 hr tablet Take 1 tablet (2.5 mg total) by mouth daily. 12/30/21   Tawnya Crook, MD  glucose blood test strip Test up to twice daily as directed in the morning fasting and when hypoglycemic 10/27/21     losartan-hydrochlorothiazide (HYZAAR) 100-25 MG tablet Take 1 tablet by mouth daily. 12/30/21   Tawnya Crook, MD  metFORMIN (GLUCOPHAGE) 1000 MG tablet Take 1 tablet (1,000 mg total) by mouth 2 (two) times daily. 12/30/21   Tawnya Crook, MD  pravastatin (PRAVACHOL) 10 MG tablet Take 1 tablet (10 mg total) by mouth daily. 12/30/21   Tawnya Crook, MD   Semaglutide,0.25 or 0.5MG /DOS, (OZEMPIC, 0.25 OR 0.5 MG/DOSE,) 2 MG/3ML SOPN Inject 0.25 mg into the skin once a week for 28 days, THEN 0.5 mg once a week for 28 days. 05/22/22 07/17/22  Tawnya Crook, MD  diltiazem (DILACOR XR) 120 MG 24 hr capsule Take 1 capsule (120 mg total) by mouth daily. Take along with 240mg  dose to equal 360mg  daily 10/13/21 10/13/21        Allergies    Nitroglycerin    Review of Systems   Review of Systems  All other systems reviewed and are negative.   Physical Exam Updated Vital Signs BP (!) 163/87 (BP Location: Left Arm)   Pulse 66   Temp 98.2 F (36.8 C)   Resp 18   Ht 5\' 9"  (1.753 m)   Wt 122.5 kg   SpO2 99%   BMI 39.87 kg/m  Physical Exam Vitals and nursing note reviewed.  Constitutional:      Appearance: Normal appearance.  HENT:     Head: Normocephalic and atraumatic.  Pulmonary:     Effort: Pulmonary effort is normal.  Musculoskeletal:     Comments: The left ankle appears grossly normal with the exception of some swelling over the lateral malleolus.  There is no medial malleoli or tenderness.  She has good range of motion.  DP pulses are easily palpable and motor and sensation are intact throughout the  entire foot.  Skin:    General: Skin is warm and dry.  Neurological:     Mental Status: She is alert and oriented to person, place, and time.     ED Results / Procedures / Treatments   Labs (all labs ordered are listed, but only abnormal results are displayed) Labs Reviewed - No data to display  EKG None  Radiology DG Foot Complete Left  Result Date: 06/16/2022 CLINICAL DATA:  Left ankle pain EXAM: LEFT FOOT - COMPLETE 3+ VIEW COMPARISON:  CT left ankle 03/27/2022 FINDINGS: Screw and plate fixation of the talus and calcaneus. Screw fragment in the lateral soft tissues is unchanged. No perihardware lucency. No acute fracture. IMPRESSION: Unchanged appearance of left ankle and hindfoot fixation hardware. No acute abnormality.  Electronically Signed   By: Ulyses Jarred M.D.   On: 06/16/2022 20:21    Procedures Procedures    Medications Ordered in ED Medications - No data to display  ED Course/ Medical Decision Making/ A&P  X-rays show no evidence for fracture.  Hardware from prior surgery is intact with the exception of one screw fragment within the soft tissues, however this is unchanged from prior studies.  Patient to be discharged with rest, ice, elevation, and follow-up with her orthopedist as needed.  Final Clinical Impression(s) / ED Diagnoses Final diagnoses:  None    Rx / DC Orders ED Discharge Orders     None         Veryl Speak, MD 06/16/22 2306

## 2022-06-16 NOTE — Discharge Instructions (Signed)
Rest.  Elevate your ankle is much as possible for the next several days.  Ice for 20 minutes every 2 hours for the next 2 days.  Take ibuprofen 600 mg every 6 hours as needed for pain.  Follow-up with your orthopedist if not improving in the next week.

## 2022-06-16 NOTE — ED Triage Notes (Signed)
Pt arrived POV. Pt c/o left ankle pain stating she was walking when she "rolled her ankle." Pt has had surgery on same ankle in the past. No obvious bruising or deformity, minor swelling. Pt reports pain to be minimal and has been able to bear weight on ankle.

## 2022-06-16 NOTE — ED Notes (Signed)
Pt agreeable with d/c plan as discussed by provider- this nurse has verbally reinforced d/c instructions and provided pt with written copy- pt acknowledges verbal understanding and denies any additional questions, concerns, needs- ambulatory independently at d/c with steady gait; no distress; vitals within baseline

## 2022-06-20 ENCOUNTER — Other Ambulatory Visit (HOSPITAL_COMMUNITY): Payer: Self-pay

## 2022-06-20 ENCOUNTER — Encounter: Payer: Self-pay | Admitting: Internal Medicine

## 2022-06-26 ENCOUNTER — Ambulatory Visit: Payer: PRIVATE HEALTH INSURANCE | Admitting: Family Medicine

## 2022-06-27 ENCOUNTER — Other Ambulatory Visit (HOSPITAL_COMMUNITY): Payer: Self-pay

## 2022-06-27 ENCOUNTER — Ambulatory Visit: Payer: PRIVATE HEALTH INSURANCE | Admitting: Family Medicine

## 2022-06-27 ENCOUNTER — Encounter: Payer: Self-pay | Admitting: Family Medicine

## 2022-06-27 VITALS — BP 140/82 | HR 66 | Temp 97.9°F | Ht 66.0 in | Wt 267.5 lb

## 2022-06-27 DIAGNOSIS — E1169 Type 2 diabetes mellitus with other specified complication: Secondary | ICD-10-CM

## 2022-06-27 DIAGNOSIS — I152 Hypertension secondary to endocrine disorders: Secondary | ICD-10-CM | POA: Diagnosis not present

## 2022-06-27 DIAGNOSIS — E785 Hyperlipidemia, unspecified: Secondary | ICD-10-CM

## 2022-06-27 DIAGNOSIS — E119 Type 2 diabetes mellitus without complications: Secondary | ICD-10-CM

## 2022-06-27 DIAGNOSIS — E1159 Type 2 diabetes mellitus with other circulatory complications: Secondary | ICD-10-CM | POA: Diagnosis not present

## 2022-06-27 DIAGNOSIS — Z23 Encounter for immunization: Secondary | ICD-10-CM | POA: Diagnosis not present

## 2022-06-27 MED ORDER — CLINDAMYCIN PHOSPHATE 1 % EX SWAB
1.0000 | Freq: Two times a day (BID) | CUTANEOUS | 3 refills | Status: DC
Start: 2022-06-27 — End: 2022-07-18
  Filled 2022-06-27 – 2022-07-17 (×2): qty 60, 30d supply, fill #0

## 2022-06-27 NOTE — Patient Instructions (Addendum)
It was very nice to see you today!  Message in 1 month to increase the ozempic to 1 mg.   Hibiclens wash for under belly.  Cloths.    get X-ray/labs at [facility]  [address]  hours 8=M-F 8:30-5.  closed 12:30-1 lunch    PLEASE NOTE:  If you had any lab tests please let us know if you have not heard back within a few days. You may see your results on MyChart before we have a chance to review them but we will give you a call once they are reviewed by [place]. If we ordered any referrals today, please let us know if you have not heard from their office within the next week.   Please try these tips to maintain a healthy lifestyle:  Eat most of your calories during the day when you are active. Eliminate processed foods including packaged sweets (pies, cakes, cookies), reduce intake of potatoes, white bread, white pasta, and white rice. Look for whole grain options, oat flour or almond flour.  Each meal should contain half fruits/vegetables, one quarter protein, and one quarter carbs (no bigger than a computer mouse).  Cut down on sweet beverages. This includes juice, soda, and sweet tea. Also watch fruit intake, though this is a healthier sweet option, it still contains natural sugar! Limit to 3 servings daily.  Drink at least 1 glass of water with each meal and aim for at least 8 glasses per day  Exercise at least 150 minutes every week.

## 2022-06-27 NOTE — Progress Notes (Signed)
Subjective:     Patient ID: Stephanie Mccall, female    DOB: 1971-10-06, 51 y.o.   MRN: [id number]  Chief Complaint  Patient presents with   Follow-up    3 month follow-up on dm     HPI  DM-sugars 110-190. Ophth-sch in next mo. Currentlyon glipizide 2.5mg  and metformin 1000mg  bid and ozempic 0.25mg . on pravastatin and ARB  active at work. Working on diet.  HTN-running 120-130/80-82.  Had eleved bp and cp so ER.  W/u neg.  Not since.  Occ HA in am for past 2 wks.  Not sleeping well-brain keeps going.  Taking losartan/hct 100/25 and amlodipine 10mg     Health Maintenance Due  Topic Date Due   FOOT EXAM  Never done   OPHTHALMOLOGY EXAM  Never done   COLONOSCOPY (Pts 45-1yrs Insurance coverage will need to be confirmed)  Never done   INFLUENZA VACCINE  06/13/2022    Past Medical History:  Diagnosis Date   Diabetes mellitus without complication ([place])    Hyperlipidemia    Hypertension     Past Surgical History:  Procedure Laterality Date   ABDOMINAL HYSTERECTOMY     partial. fibroids   ANKLE SURGERY Left    screws to straighten   CHOLECYSTECTOMY     KNEE ARTHROSCOPY Right     Outpatient Medications Prior to Visit  Medication Sig Dispense Refill   Accu-Chek Softclix Lancets lancets Test up to twice daily as directed in the morning fasting and when hypoglycemic 100 each 1   amLODipine (NORVASC) 10 MG tablet Take 1 tablet (10 mg total) by mouth daily. 90 tablet 3   aspirin EC 81 MG tablet Take 1 tablet (81 mg total) by mouth daily. 30 tablet 3   Blood Glucose Monitoring Suppl (BLOOD GLUCOSE MONITOR SYSTEM) w/Device KIT Test up to twice daily as directed in the morning fasting and when hypoglycemic 1 kit 0   cyclobenzaprine (FLEXERIL) 10 MG tablet Take 1 tablet (10 mg total) by mouth at bedtime. 30 tablet 5   glipiZIDE (GLUCOTROL XL) 2.5 MG 24 hr tablet Take 1 tablet (2.5 mg total) by mouth daily. 90 tablet 1   glucose blood test strip Test up to twice daily as directed in the  morning fasting and when hypoglycemic 100 each 1   losartan-hydrochlorothiazide (HYZAAR) 100-25 MG tablet Take 1 tablet by mouth daily. 90 tablet 3   metFORMIN (GLUCOPHAGE) 1000 MG tablet Take 1 tablet (1,000 mg total) by mouth 2 (two) times daily. 180 tablet 1   pravastatin (PRAVACHOL) 10 MG tablet Take 1 tablet (10 mg total) by mouth daily. 90 tablet 3   Semaglutide,0.25 or 0.5MG /DOS, (OZEMPIC, 0.25 OR 0.5 MG/DOSE,) 2 MG/3ML SOPN Inject 0.25 mg into the skin once a week for 28 days, THEN 0.5 mg once a week for 28 days. 3 mL 0   No facility-administered medications prior to visit.    Allergies  Allergen Reactions   Nitroglycerin Itching   ROS neg/noncontributory except as noted HPI/below Shoulder pain for 1 mo.  Taking care of patients.   Boils intermitt-painful-under pannus-showed me picture. .       Objective:     BP (!) 140/82   Pulse 66   Temp 97.9 F (36.6 C) (Temporal)   Ht 5\' 6"  (1.676 m)   Wt 267 lb 8 oz (121.3 kg)   SpO2 99%   BMI 43.18 kg/m  Wt Readings from Last 3 Encounters:  06/27/22 267 lb 8 oz (121.3 kg)  06/16/22  270 lb (122.5 kg)  04/05/22 269 lb (122 kg)    Physical Exam   Gen: WDWN NAD HEENT: NCAT, conjunctiva not injected, sclera nonicteric NECK:  supple, no thyromegaly, no nodes, no carotid bruits CARDIAC: RRR, S1S2+, no murmur. DP 2+B LUNGS: CTAB. No wheezes EXT:  no edema MSK: no gross abnormalities.  NEURO: A&O x3.  CN II-XII intact.  PSYCH: normal mood. Good eye contact     Assessment & Plan:   Problem List Items Addressed This Visit       Cardiovascular and Mediastinum   Hypertension associated with diabetes ([place])     Endocrine   Controlled type 2 diabetes mellitus without complication, without long-term current use of insulin ([facility])   Relevant Orders   Comprehensive metabolic panel   Lipid panel   Hemoglobin A1c   Vitamin B12   Hyperlipidemia associated with type 2 diabetes mellitus ([place])   Relevant Orders   Comprehensive  metabolic panel   Lipid panel   Other Visit Diagnoses     Need for shingles vaccine    -  Primary   Relevant Orders   Zoster Recombinant (Shingrix ) (Completed)     1.  Type 2 diabetes with hyperglycemia.  Now on insulin.-Chronic.  Better controlled but not ideal.  Continue working on diet and exercise.  Continue metformin 1000 mg twice daily, glipizide XL 2.5 mg daily.  Increase Ozempic to 0.5 mg weekly.  After 1 month, increase to 1 mg weekly.  Return for labs as not 90 days since last A1c.  Check lipids, CMP, A1c, B12 2.  Hypertension-chronic.  Controlled outpatient.  Continue monitoring.  Continue losartan HCT 100/25 and amlodipine 10 mg 3.  Hyperlipidemia with diabetes-chronic.  Continue pravastatin.  Check lipids, CMP 4.  Boils under pannus-intermitt-hibaclens wash.  Clinda swab if recurs.   Follow-up 6 months and as needed.  Patient will be seeing optometrist soon.  Meds ordered this encounter  Medications   clindamycin (CLEOCIN T) 1 % SWAB    Sig: Apply topically 2 (two) times daily.    Dispense:  60 each    Refill:  [place], MD

## 2022-06-28 ENCOUNTER — Other Ambulatory Visit (INDEPENDENT_AMBULATORY_CARE_PROVIDER_SITE_OTHER): Payer: PRIVATE HEALTH INSURANCE

## 2022-06-28 DIAGNOSIS — E1169 Type 2 diabetes mellitus with other specified complication: Secondary | ICD-10-CM

## 2022-06-28 DIAGNOSIS — E119 Type 2 diabetes mellitus without complications: Secondary | ICD-10-CM | POA: Diagnosis not present

## 2022-06-28 DIAGNOSIS — E785 Hyperlipidemia, unspecified: Secondary | ICD-10-CM

## 2022-06-28 LAB — COMPREHENSIVE METABOLIC PANEL
ALT: 19 U/L (ref 0–35)
AST: 16 U/L (ref 0–37)
Albumin: 4.1 g/dL (ref 3.5–5.2)
Alkaline Phosphatase: 68 U/L (ref 39–117)
BUN: 18 mg/dL (ref 6–23)
CO2: 26 mEq/L (ref 19–32)
Calcium: 9.8 mg/dL (ref 8.4–10.5)
Chloride: 100 mEq/L (ref 96–112)
Creatinine, Ser: 0.71 mg/dL (ref 0.40–1.20)
GFR: 97.86 mL/min (ref >60.00)
Glucose, Bld: 149 mg/dL — ABNORMAL HIGH (ref 70–99)
Potassium: 4 mEq/L (ref 3.5–5.1)
Sodium: 136 mEq/L (ref 135–145)
Total Bilirubin: 0.3 mg/dL (ref 0.2–1.2)
Total Protein: 7.3 g/dL (ref 6.0–8.3)

## 2022-06-28 LAB — LIPID PANEL
Cholesterol: 189 mg/dL (ref 0–200)
HDL: 48.5 mg/dL (ref >39.00)
LDL Cholesterol: 109 mg/dL — ABNORMAL HIGH (ref 0–99)
NonHDL: 140.58
Total CHOL/HDL Ratio: 4
Triglycerides: 157 mg/dL — ABNORMAL HIGH (ref 0.0–149.0)
VLDL: 31.4 mg/dL (ref 0.0–40.0)

## 2022-06-28 LAB — VITAMIN B12: Vitamin B-12: 667 pg/mL (ref 211–911)

## 2022-06-28 LAB — HEMOGLOBIN A1C: Hgb A1c MFr Bld: 8.4 % — ABNORMAL HIGH (ref 4.6–6.5)

## 2022-06-29 ENCOUNTER — Ambulatory Visit: Payer: PRIVATE HEALTH INSURANCE | Admitting: Orthopedic Surgery

## 2022-06-29 ENCOUNTER — Other Ambulatory Visit: Payer: Self-pay | Admitting: *Deleted

## 2022-06-29 DIAGNOSIS — M25572 Pain in left ankle and joints of left foot: Secondary | ICD-10-CM | POA: Diagnosis not present

## 2022-06-29 MED ORDER — PRAVASTATIN SODIUM 20 MG PO TABS: 20.0000 mg | ORAL_TABLET | Freq: Every day | ORAL | 0 refills | Status: DC

## 2022-06-29 MED ORDER — PRAVASTATIN SODIUM 20 MG PO TABS
20.0000 mg | ORAL_TABLET | Freq: Every day | ORAL | 0 refills | Status: DC
Start: 2022-06-29 — End: 2022-06-29

## 2022-07-05 ENCOUNTER — Other Ambulatory Visit (HOSPITAL_COMMUNITY): Payer: Self-pay

## 2022-07-06 ENCOUNTER — Other Ambulatory Visit (HOSPITAL_COMMUNITY): Payer: Self-pay

## 2022-07-12 ENCOUNTER — Ambulatory Visit (AMBULATORY_SURGERY_CENTER): Payer: PRIVATE HEALTH INSURANCE | Admitting: *Deleted

## 2022-07-12 ENCOUNTER — Other Ambulatory Visit (HOSPITAL_COMMUNITY): Payer: Self-pay

## 2022-07-12 VITALS — Ht 66.0 in | Wt 266.6 lb

## 2022-07-12 DIAGNOSIS — Z1211 Encounter for screening for malignant neoplasm of colon: Secondary | ICD-10-CM

## 2022-07-12 MED ORDER — NA SULFATE-K SULFATE-MG SULF 17.5-3.13-1.6 GM/177ML PO SOLN
1.0000 | Freq: Once | ORAL | 0 refills | Status: AC
  Filled 2022-07-12: qty 354, 1d supply, fill #0

## 2022-07-12 NOTE — Progress Notes (Signed)
No egg or soy allergy known to patient  No issues known to pt with past sedation with any surgeries or procedures Patient denies ever being told they had issues or difficulty with intubation  No FH of Malignant Hyperthermia Pt is not on diet pills Pt is not on home 02  Pt is not on blood thinners  Pt denies issues with constipation  No A fib or A flutter Have any cardiac testing pending--NO Pt instructed to use [email or web address] or GoodRx for a price reduction on prep   

## 2022-07-13 ENCOUNTER — Telehealth: Payer: Self-pay | Admitting: Internal Medicine

## 2022-07-13 ENCOUNTER — Other Ambulatory Visit (HOSPITAL_COMMUNITY): Payer: Self-pay

## 2022-07-13 DIAGNOSIS — Z1211 Encounter for screening for malignant neoplasm of colon: Secondary | ICD-10-CM

## 2022-07-13 MED ORDER — NA SULFATE-K SULFATE-MG SULF 17.5-3.13-1.6 GM/177ML PO SOLN
1.0000 | ORAL | 0 refills | Status: DC
  Filled 2022-07-13 – 2022-07-24 (×4): qty 354, 1d supply, fill #0

## 2022-07-13 NOTE — Telephone Encounter (Signed)
Inbound call from patients pharmacy stating that patiens insurance is requesting Suprep for patients prep and will not cover generic brand. Please advise.

## 2022-07-13 NOTE — Telephone Encounter (Signed)
Resent suprep rx to pt's pharmacy.

## 2022-07-17 ENCOUNTER — Other Ambulatory Visit: Payer: Self-pay | Admitting: Family Medicine

## 2022-07-17 DIAGNOSIS — E119 Type 2 diabetes mellitus without complications: Secondary | ICD-10-CM

## 2022-07-17 MED ORDER — SEMAGLUTIDE (1 MG/DOSE) 4 MG/3ML ~~LOC~~ SOPN
1.0000 mg | PEN_INJECTOR | SUBCUTANEOUS | 1 refills | Status: DC
  Filled 2022-07-17 – 2022-08-19 (×2): qty 3, 28d supply, fill #0

## 2022-07-17 MED ORDER — OZEMPIC (0.25 OR 0.5 MG/DOSE) 2 MG/3ML ~~LOC~~ SOPN
PEN_INJECTOR | SUBCUTANEOUS | 0 refills | Status: DC
  Filled 2022-07-17: qty 3, 56d supply, fill #0
  Filled 2022-07-18: qty 3, 28d supply, fill #0

## 2022-07-17 MED ORDER — GLIPIZIDE ER 2.5 MG PO TB24
2.5000 mg | ORAL_TABLET | Freq: Every day | ORAL | 1 refills | Status: DC
  Filled 2022-07-17: qty 30, 30d supply, fill #0
  Filled 2022-08-19: qty 30, 30d supply, fill #1
  Filled 2022-09-18: qty 30, 30d supply, fill #2
  Filled 2022-10-23: qty 30, 30d supply, fill #3
  Filled 2022-11-17 – 2022-11-27 (×2): qty 30, 30d supply, fill #4
  Filled 2022-12-24: qty 30, 30d supply, fill #5

## 2022-07-17 MED ORDER — METFORMIN HCL 1000 MG PO TABS
1000.0000 mg | ORAL_TABLET | Freq: Two times a day (BID) | ORAL | 1 refills | Status: DC
  Filled 2022-07-17: qty 60, 30d supply, fill #0
  Filled 2022-08-19: qty 60, 30d supply, fill #1
  Filled 2022-09-18: qty 60, 30d supply, fill #2
  Filled 2022-10-23: qty 60, 30d supply, fill #3
  Filled 2022-11-17 – 2022-11-27 (×2): qty 60, 30d supply, fill #4
  Filled 2022-12-24: qty 60, 30d supply, fill #5

## 2022-07-18 ENCOUNTER — Ambulatory Visit: Payer: PRIVATE HEALTH INSURANCE | Admitting: Nurse Practitioner

## 2022-07-18 ENCOUNTER — Other Ambulatory Visit (HOSPITAL_COMMUNITY): Payer: Self-pay

## 2022-07-18 ENCOUNTER — Encounter: Payer: Self-pay | Admitting: Nurse Practitioner

## 2022-07-18 VITALS — BP 138/90 | HR 88

## 2022-07-18 DIAGNOSIS — Z789 Other specified health status: Secondary | ICD-10-CM

## 2022-07-18 MED ORDER — NA SULFATE-K SULFATE-MG SULF 17.5-3.13-1.6 GM/177ML PO SOLN: 1.0000 | Freq: Once | ORAL | 0 refills | Status: AC

## 2022-07-18 NOTE — Addendum Note (Signed)
Addended by: Inocente Salles on: 07/18/2022 03:46 PM   Modules accepted: Orders

## 2022-07-18 NOTE — Telephone Encounter (Signed)
LMOM detailed message on [facility] machine.  I attempted to reach someone to speak with but was unable to. Suprep, 1 kit, 354 mL, once, no refills sent in.  Ok to use generic or name brand, no prior authorizations done.  Use Singlecare or Good rx coupon if needed

## 2022-07-18 NOTE — Telephone Encounter (Signed)
Received message from [facility] pharmacy that prep requires a prior auth. Suprep was sent in , sending to previsit.

## 2022-07-18 NOTE — Progress Notes (Signed)
Office Visit  Subjective:  Patient ID: Stephanie Mccall, female    DOB: 1971-01-17  Age: 51 y.o. MRN: [id number]  CC: Wellness Exam  HPI Stephanie Mccall presents for wellness exam visit for insurance benefit.  Patient has a PCP: Dr. Cherlynn Kaiser at [facility].  PMH significant for: HTN on amlodipine, HCTZ, and losartan. Diabetes on metformin, glipizide, and ozempic. Managed by PCP.  Last labs per PCP were completed: August 2023  Health Maintenance:  Colonoscopy: Scheduled for the end of this month.  Mammogram: May 2023 Pap Smear: hx of hysterectomy.     Smoker: never  Immunizations:  Shingrix-  completed in August 2023 COVID-  J&J and 2 boosters. Tdap: 2021  Lifestyle: Diet- eats less due to ozempic. Does try to monitor carbs Exercise- stays pretty active at work, no dedicated time for exercise.      Past Medical History:  Diagnosis Date   Allergy    SEASONAL   Arthritis    SHOULDERS   Diabetes mellitus without complication ([place])    Hyperlipidemia    Hypertension    Sleep apnea     Past Surgical History:  Procedure Laterality Date   ABDOMINAL HYSTERECTOMY     partial. fibroids   ANKLE SURGERY Left    screws to straighten   CHOLECYSTECTOMY     KNEE ARTHROSCOPY Right     Outpatient Medications Prior to Visit  Medication Sig Dispense Refill   Accu-Chek Softclix Lancets lancets Test up to twice daily as directed in the morning fasting and when hypoglycemic 100 each 1   amLODipine (NORVASC) 10 MG tablet Take 1 tablet (10 mg total) by mouth daily. 90 tablet 3   aspirin EC 81 MG tablet Take 1 tablet (81 mg total) by mouth daily. 30 tablet 3   Aspirin-Acetaminophen-Caffeine (EXCEDRIN PO) Take by mouth as needed.     Blood Glucose Monitoring Suppl (BLOOD GLUCOSE MONITOR SYSTEM) w/Device KIT Test up to twice daily as directed in the morning fasting and when hypoglycemic 1 kit 0   glipiZIDE (GLUCOTROL XL) 2.5 MG 24 hr tablet Take 1 tablet (2.5 mg total) by mouth daily. 90 tablet 1    losartan-hydrochlorothiazide (HYZAAR) 100-25 MG tablet Take 1 tablet by mouth daily. 90 tablet 3   metFORMIN (GLUCOPHAGE) 1000 MG tablet Take 1 tablet (1,000 mg total) by mouth 2 (two) times daily. 180 tablet 1   pravastatin (PRAVACHOL) 20 MG tablet Take 1 tablet (20 mg total) by mouth daily. 90 tablet 0   Semaglutide, 1 MG/DOSE, 4 MG/3ML SOPN Inject 1 mg into the skin once a week. 3 mL 1   Semaglutide,0.25 or 0.5MG /DOS, (OZEMPIC, 0.25 OR 0.5 MG/DOSE,) 2 MG/3ML SOPN Inject 0.25 mg into the skin once a week for 4 weeks, THEN 0.5 mg once a week for 4 weeks. 3 mL 0   Acetaminophen (TYLENOL PO) Take by mouth as needed.     clindamycin (CLEOCIN T) 1 % SWAB Apply topically 2 (two) times daily. (Patient not taking: Reported on 07/12/2022) 60 each 3   cyclobenzaprine (FLEXERIL) 10 MG tablet Take 1 tablet (10 mg total) by mouth at bedtime. (Patient not taking: Reported on 07/12/2022) 30 tablet 5   glucose blood test strip Test up to twice daily as directed in the morning fasting and when hypoglycemic 100 each 1   Na Sulfate-K Sulfate-Mg Sulf 17.5-3.13-1.6 GM/177ML SOLN Use as directed 354 mL 0   No facility-administered medications prior to visit.    ROS Review of Systems  Respiratory:  Negative  for shortness of breath.   Cardiovascular:  Negative for chest pain.  Gastrointestinal:  Negative for constipation and diarrhea.  Musculoskeletal:  Positive for back pain (shoulder pain).  Neurological:  Positive for headaches (sometimes, uses excedrin as needed).    Objective:  BP (!) 138/90   Pulse 88   SpO2 97%   Physical Exam Constitutional:      General: She is not in acute distress. HENT:     Head: Normocephalic.  Cardiovascular:     Rate and Rhythm: Normal rate.     Heart sounds: Normal heart sounds.  Pulmonary:     Effort: Pulmonary effort is normal.  Musculoskeletal:        General: Normal range of motion.     Right lower leg: No edema.     Left lower leg: No edema.  Skin:    General:  Skin is warm.  Neurological:     General: No focal deficit present.     Mental Status: She is alert and oriented to person, place, and time.  Psychiatric:        Mood and Affect: Mood normal.        Behavior: Behavior normal.     Assessment & Plan:    Stephanie Mccall was seen today for wellness exam.  Diagnoses and all orders for this visit:  Participant in health and wellness plan Adult wellness physical was conducted today. Importance of diet and exercise were discussed in detail.  We reviewed immunizations, patient is up to date with immunizations.  Preventative health exams needed: Colonoscopy pending for the end of this month.   Patient was advised yearly wellness exam. Follow-up with PCP as scheduled.    No orders of the defined types were placed in this encounter.   No orders of the defined types were placed in this encounter.   Follow-up: Return if symptoms worsen or fail to improve.

## 2022-07-24 ENCOUNTER — Other Ambulatory Visit (HOSPITAL_COMMUNITY): Payer: Self-pay

## 2022-07-25 ENCOUNTER — Other Ambulatory Visit: Payer: Self-pay | Admitting: Family Medicine

## 2022-07-25 ENCOUNTER — Other Ambulatory Visit (HOSPITAL_COMMUNITY): Payer: Self-pay

## 2022-07-25 MED ORDER — PRAVASTATIN SODIUM 20 MG PO TABS
20.0000 mg | ORAL_TABLET | Freq: Every day | ORAL | 0 refills | Status: DC
  Filled 2022-07-25 – 2022-08-09 (×2): qty 30, 30d supply, fill #0
  Filled 2022-09-03: qty 30, 30d supply, fill #1
  Filled 2022-10-15: qty 30, 30d supply, fill #2

## 2022-07-26 ENCOUNTER — Other Ambulatory Visit (HOSPITAL_COMMUNITY): Payer: Self-pay

## 2022-07-27 ENCOUNTER — Encounter: Payer: Self-pay | Admitting: Internal Medicine

## 2022-07-28 LAB — HM DIABETES EYE EXAM

## 2022-07-31 ENCOUNTER — Encounter: Payer: Self-pay | Admitting: Orthopedic Surgery

## 2022-07-31 NOTE — Progress Notes (Signed)
Office Visit Note   Patient: Stephanie Mccall           Date of Birth: 03/27/1971           MRN: [id number] Visit Date: 06/29/2022              Requested by: Jeani Sow, MD [address] [place],  [place] [address] PCP: Jeani Sow, MD  Chief Complaint  Patient presents with   Left Ankle - Follow-up      HPI: Patient is a 51 year old woman who presents complaining of pain lateral aspect of her left ankle.  She is status post lengthening calcaneal osteotomy as well as a second osteotomy with painful broken retained hardware.  Patient has pain over the calcaneocuboid joint.  Assessment & Plan: Visit Diagnoses:  1. Pain in left ankle and joints of left foot     Plan: Patient states she would like to proceed with surgical intervention in January.  Discussed that we could remove the broken retained hardware evaluate the peroneal tendons and plan for a fusion of the calcaneocuboid joint.  Risks and benefits were discussed including wound healing complications and bone healing complications.  Patient states she understands and will call to set up surgery.  Anticipate surgery could include a extensile incision to incorporate the posterior vertical incision.  Follow-Up Instructions: Return if symptoms worsen or fail to improve.   Ortho Exam  Patient is alert, oriented, no adenopathy, well-dressed, normal affect, normal respiratory effort. Examination patient has a good dorsalis pedis pulse.  She has pain with eversion pain over the calcaneocuboid joint to palpation.  She has numbness in the sural nerve distribution.  She has 2 vertical incisions over the calcaneus with painful retained hardware.  She also has tenderness palpation over the peroneal tendons.  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  Imaging: No results found. No images are attached to the encounter.  Labs: Lab Results  Component Value Date    HGBA1C 8.4 (H) 06/28/2022   HGBA1C 7.8 (H) 03/29/2022   HGBA1C 8.6 10/13/2021     Lab Results  Component Value Date   ALBUMIN 4.1 06/28/2022   ALBUMIN 4.4 03/29/2022   ALBUMIN 4.6 11/23/2021    No results found for: "MG" Lab Results  Component Value Date   VD25OH 31.72 10/14/2021    No results found for: "PREALBUMIN"    Latest Ref Rng & Units 04/05/2022    9:35 AM 03/29/2022    9:09 AM 11/23/2021   12:00 AM  CBC EXTENDED  WBC 4.0 - 10.5 K/uL 4.6  4.5  6.0      RBC 3.87 - 5.11 MIL/uL 5.18  4.63  4.97      Hemoglobin 12.0 - 15.0 g/dL 01.0  27.2  53.6      HCT 36.0 - 46.0 % 45.2  39.3  43      Platelets 150 - 400 K/uL 259  256.0  303      NEUT# 1.4 - 7.7 K/uL  2.5    Lymph# 0.7 - 4.0 K/uL  1.6       This result is from an external source.     There is no height or weight on file to calculate BMI.  Orders:  No orders of the defined types were placed in this encounter.  No orders of the defined types were placed in this encounter.    Procedures: No procedures performed  Clinical  Data: No additional findings.  ROS:  All other systems negative, except as noted in the HPI. Review of Systems  Objective: Vital Signs: There were no vitals taken for this visit.  Specialty Comments:  No specialty comments available.  PMFS History: Patient Active Problem List   Diagnosis Date Noted   Controlled type 2 diabetes mellitus without complication, without long-term current use of insulin ([place]) 09/29/2021   Hypertension associated with diabetes ([place]) 09/29/2021   Hyperlipidemia associated with type 2 diabetes mellitus ([place]) 09/29/2021   Morbid obesity ([place]) 09/29/2021   Past Medical History:  Diagnosis Date   Allergy    SEASONAL   Arthritis    SHOULDERS   Diabetes mellitus without complication ([facility])    Hyperlipidemia    Hypertension    Sleep apnea     Family History  Problem Relation Age of Onset   Lung cancer Maternal Aunt    Lung cancer Maternal Uncle     Breast cancer Maternal Grandmother    Colon cancer Neg Hx    Colon polyps Neg Hx    Crohn's disease Neg Hx    Esophageal cancer Neg Hx    Rectal cancer Neg Hx    Stomach cancer Neg Hx    Ulcerative colitis Neg Hx     Past Surgical History:  Procedure Laterality Date   ABDOMINAL HYSTERECTOMY     partial. fibroids   ANKLE SURGERY Left    screws to straighten   CHOLECYSTECTOMY     KNEE ARTHROSCOPY Right    Social History   Occupational History   Not on file  Tobacco Use   Smoking status: Never    Passive exposure: Past   Smokeless tobacco: Never  Vaping Use   Vaping Use: Never used  Substance and Sexual Activity   Alcohol use: Yes    Comment: occasoinally   Drug use: Never   Sexual activity: Yes

## 2022-08-02 ENCOUNTER — Encounter: Payer: Self-pay | Admitting: Family Medicine

## 2022-08-03 ENCOUNTER — Other Ambulatory Visit (HOSPITAL_COMMUNITY): Payer: Self-pay

## 2022-08-07 ENCOUNTER — Encounter: Payer: Self-pay | Admitting: *Deleted

## 2022-08-09 ENCOUNTER — Other Ambulatory Visit (HOSPITAL_COMMUNITY): Payer: Self-pay

## 2022-08-10 ENCOUNTER — Ambulatory Visit (AMBULATORY_SURGERY_CENTER): Payer: PRIVATE HEALTH INSURANCE | Admitting: Internal Medicine

## 2022-08-10 ENCOUNTER — Encounter: Payer: Self-pay | Admitting: Internal Medicine

## 2022-08-10 VITALS — BP 144/87 | HR 53 | Temp 97.5°F | Resp 15 | Ht 66.0 in | Wt 266.6 lb

## 2022-08-10 DIAGNOSIS — Z1211 Encounter for screening for malignant neoplasm of colon: Secondary | ICD-10-CM | POA: Diagnosis not present

## 2022-08-10 MED ORDER — SODIUM CHLORIDE 0.9 % IV SOLN: 500.0000 mL | Freq: Once | INTRAVENOUS | Status: DC

## 2022-08-10 NOTE — Op Note (Signed)
[facility] Patient Name: Stephanie Mccall Procedure Date: 08/10/2022 9:38 AM MRN: [id number] Endoscopist: Stephanie Mccall , MD Age: 51 Referring MD:  Date of Birth: 1971-10-30 Gender: Female Account #: [id number] Procedure:                Colonoscopy Indications:              Screening colonoscopy?"average risk; incidental                            complaints of frequent bowel movements Medicines:                Monitored Anesthesia Care Procedure:                Pre-Anesthesia Assessment:                           - Prior to the procedure, a History and Physical                            was performed, and patient medications and                            allergies were reviewed. The patient's tolerance of                            previous anesthesia was also reviewed. The risks                            and benefits of the procedure and the sedation                            options and risks were discussed with the patient.                            All questions were answered, and informed consent                            was obtained. Prior Anticoagulants: The patient has                            taken no previous anticoagulant or antiplatelet                            agents. ASA Grade Assessment: II - A patient with                            mild systemic disease. After reviewing the risks                            and benefits, the patient was deemed in                            satisfactory condition to undergo the procedure.  After obtaining informed consent, the colonoscope                            was passed under direct vision. Throughout the                            procedure, the patient's blood pressure, pulse, and                            oxygen saturations were monitored continuously. The                            Olympus CF-HQ190L [id number]) Colonoscope was                            introduced through the anus  and advanced to the the                            cecum, identified by appendiceal orifice and                            ileocecal valve. The ileocecal valve, appendiceal                            orifice, and rectum were photographed. The quality                            of the bowel preparation was excellent. The                            colonoscopy was performed without difficulty. The                            patient tolerated the procedure well. The bowel                            preparation used was SUPREP via split dose                            instruction. Scope In: 9:44:21 AM Scope Out: 9:58:37 AM Scope Withdrawal Time: 0 hours 8 minutes 38 seconds  Total Procedure Duration: 0 hours 14 minutes 16 seconds  Findings:                 The entire examined colon appeared normal on direct                            and retroflexion views. Complications:            No immediate complications. Estimated blood loss:                            None. Estimated Blood Loss:     Estimated blood loss: none. Impression:               - The entire examined colon is  normal on direct and                            retroflexion views.                           - No specimens collected. Recommendation:           - Repeat colonoscopy in 10 years for screening                            purposes.                           - Patient has a contact number available for                            emergencies. The signs and symptoms of potential                            delayed complications were discussed with the                            patient. Return to normal activities tomorrow.                            Written discharge instructions were provided to the                            patient.                           - Resume previous diet.                           - Continue present medications. Stephanie Mccall. Stephanie Goodell, MD 08/10/2022 10:05:02 AM This report has been signed  electronically.

## 2022-08-10 NOTE — Patient Instructions (Signed)
Resume previous diet and continue present medications. Repeat colonoscopy in 10 years for surveillance!  YOU HAD AN ENDOSCOPIC PROCEDURE TODAY AT THE [facility]:   Refer to the procedure report that was given to you for any specific questions about what was found during the examination.  If the procedure report does not answer your questions, please call your gastroenterologist to clarify.  If you requested that your care partner not be given the details of your procedure findings, then the procedure report has been included in a sealed envelope for you to review at your convenience later.  YOU SHOULD EXPECT: Some feelings of bloating in the abdomen. Passage of more gas than usual.  Walking can help get rid of the air that was put into your GI tract during the procedure and reduce the bloating. If you had a lower endoscopy (such as a colonoscopy or flexible sigmoidoscopy) you may notice spotting of blood in your stool or on the toilet paper. If you underwent a bowel prep for your procedure, you may not have a normal bowel movement for a few days.  Please Note:  You might notice some irritation and congestion in your nose or some drainage.  This is from the oxygen used during your procedure.  There is no need for concern and it should clear up in a day or so.  SYMPTOMS TO REPORT IMMEDIATELY:  Following lower endoscopy (colonoscopy or flexible sigmoidoscopy):  Excessive amounts of blood in the stool  Significant tenderness or worsening of abdominal pains  Swelling of the abdomen that is new, acute  Fever of 100F or higher  For urgent or emergent issues, a gastroenterologist can be reached at any hour by calling [phone]. Do not use MyChart messaging for urgent concerns.    DIET:  We do recommend a small meal at first, but then you may proceed to your regular diet.  Drink plenty of fluids but you should avoid alcoholic beverages for 24 hours.  ACTIVITY:  You should plan to  take it easy for the rest of today and you should NOT DRIVE or use heavy machinery until tomorrow (because of the sedation medicines used during the test).    FOLLOW UP: Our staff will call the number listed on your records the next business day following your procedure.  We will call around 7:15- 8:00 am to check on you and address any questions or concerns that you may have regarding the information given to you following your procedure. If we do not reach you, we will leave a message.     If any biopsies were taken you will be contacted by phone or by letter within the next 1-3 weeks.  Please call us at [phone] if you have not heard about the biopsies in 3 weeks.    SIGNATURES/CONFIDENTIALITY: You and/or your care partner have signed paperwork which will be entered into your electronic medical record.  These signatures attest to the fact that that the information above on your After Visit Summary has been reviewed and is understood.  Full responsibility of the confidentiality of this discharge information lies with you and/or your care-partner. 

## 2022-08-10 NOTE — Progress Notes (Signed)
HISTORY OF PRESENT ILLNESS:  Stephanie Mccall is a 51 y.o. female who presents today for routine screening colonoscopy..  She tells me that she had colonoscopy and upper endoscopy in [place] 3 or 4 years ago.  Apparently had ulcers.  Does not think that there are any problems or colonoscopies.  She does report frequent bowel movements seemingly related to her diabetes medications  REVIEW OF SYSTEMS:  All non-GI ROS negative.  Past Medical History:  Diagnosis Date   Allergy    SEASONAL   Arthritis    SHOULDERS   Diabetes mellitus without complication ([place])    Hyperlipidemia    Hypertension    Sleep apnea     Past Surgical History:  Procedure Laterality Date   ABDOMINAL HYSTERECTOMY     partial. fibroids   ANKLE SURGERY Left    screws to straighten   CHOLECYSTECTOMY     KNEE ARTHROSCOPY Right     Social History Stephanie Mccall  reports that she has never smoked. She has been exposed to tobacco smoke. She has never used smokeless tobacco. She reports current alcohol use. She reports that she does not use drugs.  family history includes Breast cancer in her maternal grandmother; Lung cancer in her maternal aunt and maternal uncle.  Allergies  Allergen Reactions   Nitroglycerin Itching       PHYSICAL EXAMINATION: Vital signs: BP 133/66   Pulse (!) 54   Temp (!) 97.5 F (36.4 C) (Temporal)   Ht 5\' 6"  (1.676 m)   Wt 266 lb 9.6 oz (120.9 kg)   SpO2 98%   BMI 43.03 kg/m  General: Well-developed, well-nourished, no acute distress HEENT: Sclerae are anicteric, conjunctiva pink. Oral mucosa intact Lungs: Clear Heart: Regular Abdomen: soft, nontender, nondistended, no obvious ascites, no peritoneal signs, normal bowel sounds. No organomegaly. Extremities: No edema Psychiatric: alert and oriented x3. Cooperative      ASSESSMENT:  Colon cancer screening   PLAN:  Screening colonoscopy

## 2022-08-10 NOTE — Progress Notes (Signed)
Vss nad trans to pacu °

## 2022-08-10 NOTE — Progress Notes (Signed)
Pt's states no medical or surgical changes since previsit or office visit. 

## 2022-08-21 ENCOUNTER — Other Ambulatory Visit: Payer: Self-pay | Admitting: *Deleted

## 2022-08-21 ENCOUNTER — Telehealth: Payer: Self-pay | Admitting: Family Medicine

## 2022-08-21 ENCOUNTER — Other Ambulatory Visit (HOSPITAL_COMMUNITY): Payer: Self-pay

## 2022-08-21 DIAGNOSIS — M546 Pain in thoracic spine: Secondary | ICD-10-CM

## 2022-08-21 MED ORDER — LIDOCAINE 5 % EX PTCH
1.0000 | MEDICATED_PATCH | CUTANEOUS | 0 refills | Status: DC
  Filled 2022-08-21 (×2): qty 30, 30d supply, fill #0

## 2022-08-21 NOTE — Telephone Encounter (Signed)
Patient requests new RX for Lidocaine Patch (back pain discussed at previous office visit) be sent to  [facility]

## 2022-08-21 NOTE — Telephone Encounter (Signed)
Rx sent to the pharmacy.

## 2022-08-28 ENCOUNTER — Other Ambulatory Visit (HOSPITAL_COMMUNITY): Payer: Self-pay

## 2022-08-28 MED ORDER — BENZONATATE 100 MG PO CAPS
100.0000 mg | ORAL_CAPSULE | Freq: Three times a day (TID) | ORAL | 0 refills | Status: DC
  Filled 2022-08-28: qty 30, 5d supply, fill #0

## 2022-08-28 MED ORDER — IBUPROFEN 800 MG PO TABS
800.0000 mg | ORAL_TABLET | Freq: Three times a day (TID) | ORAL | 0 refills | Status: DC | PRN
  Filled 2022-08-28: qty 30, 10d supply, fill #0

## 2022-08-28 MED ORDER — PAXLOVID (300/100) 20 X 150 MG & 10 X 100MG PO TBPK
ORAL_TABLET | ORAL | 0 refills | Status: DC
  Filled 2022-08-28: qty 30, 5d supply, fill #0

## 2022-08-28 MED ORDER — HYDROXYZINE HCL 10 MG PO TABS
10.0000 mg | ORAL_TABLET | ORAL | 0 refills | Status: DC | PRN
  Filled 2022-08-28: qty 30, 2d supply, fill #0

## 2022-08-29 ENCOUNTER — Ambulatory Visit: Payer: PRIVATE HEALTH INSURANCE | Admitting: Orthopedic Surgery

## 2022-09-03 ENCOUNTER — Other Ambulatory Visit: Payer: Self-pay | Admitting: Family Medicine

## 2022-09-03 ENCOUNTER — Other Ambulatory Visit (HOSPITAL_COMMUNITY): Payer: Self-pay

## 2022-09-03 DIAGNOSIS — E119 Type 2 diabetes mellitus without complications: Secondary | ICD-10-CM

## 2022-09-04 ENCOUNTER — Encounter: Payer: Self-pay | Admitting: *Deleted

## 2022-09-04 ENCOUNTER — Other Ambulatory Visit (HOSPITAL_COMMUNITY): Payer: Self-pay

## 2022-09-04 MED ORDER — OZEMPIC (0.25 OR 0.5 MG/DOSE) 2 MG/3ML ~~LOC~~ SOPN
PEN_INJECTOR | SUBCUTANEOUS | 0 refills | Status: DC
  Filled 2022-09-04: qty 3, 28d supply, fill #0

## 2022-09-04 NOTE — Telephone Encounter (Signed)
Left message to return my call.  

## 2022-09-05 ENCOUNTER — Other Ambulatory Visit (HOSPITAL_COMMUNITY): Payer: Self-pay

## 2022-09-05 ENCOUNTER — Other Ambulatory Visit: Payer: Self-pay | Admitting: Family Medicine

## 2022-09-05 MED ORDER — SEMAGLUTIDE (1 MG/DOSE) 4 MG/3ML ~~LOC~~ SOPN
1.0000 mg | PEN_INJECTOR | SUBCUTANEOUS | 1 refills | Status: DC
  Filled 2022-09-05 – 2022-09-18 (×3): qty 3, 28d supply, fill #0
  Filled 2022-10-15: qty 3, 28d supply, fill #1

## 2022-09-13 ENCOUNTER — Other Ambulatory Visit (HOSPITAL_COMMUNITY): Payer: Self-pay

## 2022-09-18 ENCOUNTER — Other Ambulatory Visit (HOSPITAL_COMMUNITY): Payer: Self-pay

## 2022-09-18 ENCOUNTER — Other Ambulatory Visit: Payer: Self-pay | Admitting: Family Medicine

## 2022-09-18 DIAGNOSIS — M546 Pain in thoracic spine: Secondary | ICD-10-CM

## 2022-09-18 MED ORDER — LIDOCAINE 5 % EX PTCH
1.0000 | MEDICATED_PATCH | CUTANEOUS | 3 refills | Status: DC
  Filled 2022-09-18: qty 30, 30d supply, fill #0
  Filled 2022-10-15: qty 30, 30d supply, fill #1
  Filled 2022-11-17 – 2022-11-27 (×2): qty 30, 30d supply, fill #2
  Filled 2022-12-24 – 2023-07-08 (×5): qty 30, 30d supply, fill #3

## 2022-09-19 ENCOUNTER — Other Ambulatory Visit (HOSPITAL_COMMUNITY): Payer: Self-pay

## 2022-09-21 ENCOUNTER — Other Ambulatory Visit (HOSPITAL_COMMUNITY): Payer: Self-pay

## 2022-10-16 ENCOUNTER — Other Ambulatory Visit (HOSPITAL_COMMUNITY): Payer: Self-pay

## 2022-10-20 ENCOUNTER — Other Ambulatory Visit (HOSPITAL_COMMUNITY): Payer: Self-pay

## 2022-10-23 ENCOUNTER — Other Ambulatory Visit (HOSPITAL_COMMUNITY): Payer: Self-pay

## 2022-10-23 ENCOUNTER — Encounter: Payer: Self-pay | Admitting: Family Medicine

## 2022-10-23 ENCOUNTER — Ambulatory Visit: Payer: PRIVATE HEALTH INSURANCE | Admitting: Family Medicine

## 2022-10-23 VITALS — BP 130/76 | HR 69 | Temp 98.3°F | Ht 66.0 in | Wt 257.0 lb

## 2022-10-23 DIAGNOSIS — J40 Bronchitis, not specified as acute or chronic: Secondary | ICD-10-CM

## 2022-10-23 MED ORDER — AMOXICILLIN-POT CLAVULANATE 875-125 MG PO TABS
1.0000 | ORAL_TABLET | Freq: Two times a day (BID) | ORAL | 0 refills | Status: DC
  Filled 2022-10-23: qty 20, 10d supply, fill #0

## 2022-10-23 NOTE — Progress Notes (Signed)
Subjective:     Patient ID: Stephanie Mccall, female    DOB: 1971/09/08, 51 y.o.   MRN: [id number]  Chief Complaint  Patient presents with   Cough    Lingering productive cough x 2 weeks     HPI Cough for >4wks-dx covid on 10/16(got better, but then dry cough 2 wks later.  Having to clear throat.  Has had runny nose.  Last pm, scratchy throat.  No sob.  This am, brown mucus w/some red.   No f/c.   No v/d.  Otc not helping.   Sugars 120's.   Pain B shoulder-for >2 mo.  Getting worse.  Starts about 8pm, hard to reach back.  Does ok all day at work [employer].    Health Maintenance Due  Topic Date Due   FOOT EXAM  Never done   DTaP/Tdap/Td (1 - Tdap) Never done    Past Medical History:  Diagnosis Date   Allergy    SEASONAL   Arthritis    SHOULDERS   Diabetes mellitus without complication ([place])    Hyperlipidemia    Hypertension    Sleep apnea     Past Surgical History:  Procedure Laterality Date   ABDOMINAL HYSTERECTOMY     partial. fibroids   ANKLE SURGERY Left    screws to straighten   CHOLECYSTECTOMY     KNEE ARTHROSCOPY Right     Outpatient Medications Prior to Visit  Medication Sig Dispense Refill   Accu-Chek Softclix Lancets lancets Test up to twice daily as directed in the morning fasting and when hypoglycemic 100 each 1   Acetaminophen (TYLENOL PO) Take by mouth as needed.     amLODipine (NORVASC) 10 MG tablet Take 1 tablet (10 mg total) by mouth daily. 90 tablet 3   aspirin EC 81 MG tablet Take 1 tablet (81 mg total) by mouth daily. 30 tablet 3   Aspirin-Acetaminophen-Caffeine (EXCEDRIN PO) Take by mouth as needed.     benzonatate (TESSALON) 100 MG capsule Take 1-2 capsules (100-200 mg total) by mouth 3 (three) times daily. 30 capsule 0   Blood Glucose Monitoring Suppl (BLOOD GLUCOSE MONITOR SYSTEM) w/Device KIT Test up to twice daily as directed in the morning fasting and when hypoglycemic 1 kit 0   glipiZIDE (GLUCOTROL XL) 2.5 MG 24 hr tablet Take 1 tablet (2.5 mg  total) by mouth daily. 90 tablet 1   glucose blood test strip Test up to twice daily as directed in the morning fasting and when hypoglycemic 100 each 1   hydrOXYzine (ATARAX) 10 MG tablet Take 1-3 tablets (10-30 mg total) by mouth every 4 - 6  hours as needed. 30 tablet 0   ibuprofen (ADVIL) 800 MG tablet Take 1 tablet (800 mg total) by mouth every 8 (eight) hours as needed for mild to moderate pain for up to 10 days 30 tablet 0   lidocaine (LIDODERM) 5 % Place 1 patch onto the skin daily. Remove & Discard patch within 12 hours or as directed. 30 patch 3   losartan-hydrochlorothiazide (HYZAAR) 100-25 MG tablet Take 1 tablet by mouth daily. 90 tablet 3   metFORMIN (GLUCOPHAGE) 1000 MG tablet Take 1 tablet (1,000 mg total) by mouth 2 (two) times daily. 180 tablet 1   pravastatin (PRAVACHOL) 20 MG tablet Take 1 tablet (20 mg total) by mouth daily. 90 tablet 0   Semaglutide, 1 MG/DOSE, 4 MG/3ML SOPN Inject 1 mg into the skin once a week. 3 mL 1   nirmatrelvir &  ritonavir (PAXLOVID, 300/100,) 20 x 150 MG & 10 x 100MG TBPK Take a total of 3 tablets (2 of the 150 mg tablets and 1 of the 100 mg tablets) by mouth 2 (two) times a day. Do all this for 5 days. (Patient not taking: Reported on 10/23/2022) 30 each 0   No facility-administered medications prior to visit.    Allergies  Allergen Reactions   Nitroglycerin Itching   ROS neg/noncontributory except as noted HPI/below      Objective:     BP 130/76   Pulse 69   Temp 98.3 F (36.8 C) (Temporal)   Ht _0  (1.676 m)   Wt 257 lb (116.6 kg)   SpO2 99%   BMI 41.48 kg/m  Wt Readings from Last 3 Encounters:  10/23/22 257 lb (116.6 kg)  08/10/22 266 lb 9.6 oz (120.9 kg)  07/12/22 266 lb 9.6 oz (120.9 kg)    Physical Exam   Gen: WDWN NAD HEENT: NCAT, conjunctiva not injected, sclera nonicteric TM WNL B, OP moist, no exudates  a little congested NECK:  supple, ? thyromegaly,mild submand nodes, no carotid bruits CARDIAC: RRR, S1S2+, no  murmur.  LUNGS: CTAB. No wheezes. Some dec BS L base EXT:  no edema MSK: no gross abnormalities.  NEURO: A&O x3.  CN II-XII intact.  PSYCH: normal mood. Good eye contact     Assessment & Plan:   Problem List Items Addressed This Visit   None Visit Diagnoses     Bronchitis    -  Primary      Bronchitis-augmentin 875 bid.  If not improving or wosening or new symptoms, will do cxr.  Shoulder pain-see sports med  No orders of the defined types were placed in this encounter.   Wellington Hampshire, MD

## 2022-10-23 NOTE — Patient Instructions (Addendum)
It was very nice to see you today!  [facility]  [address] on the 1st floor Phone number [phone]   If not getting better from cough or worse, then call to get chest x-ray  Happy Holidays!   PLEASE NOTE:  If you had any lab tests please let us know if you have not heard back within a few days. You may see your results on MyChart before we have a chance to review them but we will give you a call once they are reviewed by [place]. If we ordered any referrals today, please let us know if you have not heard from their office within the next week.   Please try these tips to maintain a healthy lifestyle:  Eat most of your calories during the day when you are active. Eliminate processed foods including packaged sweets (pies, cakes, cookies), reduce intake of potatoes, white bread, white pasta, and white rice. Look for whole grain options, oat flour or almond flour.  Each meal should contain half fruits/vegetables, one quarter protein, and one quarter carbs (no bigger than a computer mouse).  Cut down on sweet beverages. This includes juice, soda, and sweet tea. Also watch fruit intake, though this is a healthier sweet option, it still contains natural sugar! Limit to 3 servings daily.  Drink at least 1 glass of water with each meal and aim for at least 8 glasses per day  Exercise at least 150 minutes every week.

## 2022-11-09 NOTE — Progress Notes (Deleted)
   I, Philbert Riser, LAT, ATC acting as a scribe for Clementeen Graham, MD.  Subjective:    CC: Bilateral shoulder pain  HPI: Patient is a 51 year old female presenting with bilateral shoulder pain?  Patient locates pain to?  Neck pain: Radiates: UE numbness/tingling: UE weakness: Aggravates: Treatments tried:   Pertinent review of Systems: ***  Relevant historical information: ***   Objective:   There were no vitals filed for this visit. General: Well Developed, well nourished, and in no acute distress.   MSK: ***  Lab and Radiology Results No results found for this or any previous visit (from the past 72 hour(s)). No results found.    Impression and Recommendations:    Assessment and Plan: 51 y.o. female with ***.  PDMP not reviewed this encounter. No orders of the defined types were placed in this encounter.  No orders of the defined types were placed in this encounter.   Discussed warning signs or symptoms. Please see discharge instructions. Patient expresses understanding.   ***

## 2022-11-14 ENCOUNTER — Ambulatory Visit: Payer: Self-pay | Admitting: Family Medicine

## 2022-11-14 DIAGNOSIS — G8929 Other chronic pain: Secondary | ICD-10-CM

## 2022-11-17 ENCOUNTER — Other Ambulatory Visit (HOSPITAL_COMMUNITY): Payer: Self-pay

## 2022-11-17 ENCOUNTER — Other Ambulatory Visit: Payer: Self-pay | Admitting: Family Medicine

## 2022-11-17 MED ORDER — PRAVASTATIN SODIUM 20 MG PO TABS
20.0000 mg | ORAL_TABLET | Freq: Every day | ORAL | 0 refills | Status: DC
  Filled 2022-11-17: qty 30, 30d supply, fill #0
  Filled 2022-12-24: qty 30, 30d supply, fill #1

## 2022-11-23 ENCOUNTER — Other Ambulatory Visit (HOSPITAL_COMMUNITY): Payer: Self-pay

## 2022-11-25 ENCOUNTER — Other Ambulatory Visit: Payer: Self-pay

## 2022-11-27 ENCOUNTER — Other Ambulatory Visit (HOSPITAL_COMMUNITY): Payer: Self-pay

## 2022-11-27 ENCOUNTER — Telehealth: Payer: Self-pay | Admitting: Family Medicine

## 2022-11-27 ENCOUNTER — Other Ambulatory Visit: Payer: Self-pay | Admitting: Family Medicine

## 2022-11-27 ENCOUNTER — Other Ambulatory Visit: Payer: Self-pay

## 2022-11-27 ENCOUNTER — Other Ambulatory Visit: Payer: Self-pay | Admitting: *Deleted

## 2022-11-27 MED ORDER — OZEMPIC (1 MG/DOSE) 4 MG/3ML ~~LOC~~ SOPN
1.0000 mg | PEN_INJECTOR | SUBCUTANEOUS | 1 refills | Status: DC
  Filled 2022-11-27 – 2022-11-29 (×2): qty 3, 28d supply, fill #0
  Filled 2022-12-24: qty 3, 28d supply, fill #1

## 2022-11-27 MED ORDER — GLUCOSE BLOOD VI STRP
ORAL_STRIP | 1 refills | Status: DC
  Filled 2022-11-27: qty 100, 50d supply, fill #0
  Filled 2023-03-29: qty 100, 50d supply, fill #1

## 2022-11-27 NOTE — Telephone Encounter (Signed)
Rx sent to the pharmacy.

## 2022-11-27 NOTE — Telephone Encounter (Signed)
LAST APPOINTMENT DATE:  10/23/22  NEXT APPOINTMENT DATE: 01/02/23  MEDICATION: glucose blood test strip   Is the patient out of medication? Yes  PHARMACY:  [facility] [address], [place] [address] Phone: [phone]  Fax: [phone]

## 2022-11-28 ENCOUNTER — Ambulatory Visit: Payer: PRIVATE HEALTH INSURANCE | Admitting: Family Medicine

## 2022-11-28 ENCOUNTER — Ambulatory Visit (INDEPENDENT_AMBULATORY_CARE_PROVIDER_SITE_OTHER): Payer: PRIVATE HEALTH INSURANCE

## 2022-11-28 ENCOUNTER — Other Ambulatory Visit: Payer: Self-pay

## 2022-11-28 ENCOUNTER — Ambulatory Visit: Payer: Self-pay

## 2022-11-28 ENCOUNTER — Other Ambulatory Visit (HOSPITAL_COMMUNITY): Payer: Self-pay

## 2022-11-28 VITALS — BP 158/98 | HR 63 | Ht 66.0 in | Wt 258.0 lb

## 2022-11-28 DIAGNOSIS — M25511 Pain in right shoulder: Secondary | ICD-10-CM

## 2022-11-28 DIAGNOSIS — G8929 Other chronic pain: Secondary | ICD-10-CM

## 2022-11-28 DIAGNOSIS — M25512 Pain in left shoulder: Secondary | ICD-10-CM | POA: Diagnosis not present

## 2022-11-28 NOTE — Patient Instructions (Addendum)
Thank you for coming in today.   Please get an Xray today before you leave   You received an injection today. Seek immediate medical attention if the joint becomes red, extremely painful, or is oozing fluid.   I've referred you to Physical Therapy.  Let us know if you don't hear from them in one week.   Keep me updated.

## 2022-11-28 NOTE — Progress Notes (Signed)
I, Peterson Lombard, LAT, ATC acting as a scribe for Lynne Leader, MD. Subjective:    CC: Bilateral shoulder pain  HPI: Patient is a 52 year old female presenting with bilateral shoulder pain ongoing for a couple months. Patient locates pain to all over both shoulder joints w/ radiating pain into both arms. Pt c/o shoulder pain waking her up at night. Pt works as a Technical brewer at [employer].   Neck pain: yes Radiates: yes UE numbness/tingling: no UE weakness: no Aggravates: IR, trying to put on her bra, flex, overhead Treatments tried: Tylenol, lidocaine patch  Pertinent review of Systems: No fevers or chills  Relevant historical information: Hypertension and diabetes.   Objective:    Vitals:   11/28/22 1439 11/28/22 1457  BP: (!) 158/98 (!) 158/98  Pulse: 63   SpO2: 98%    General: Well Developed, well nourished, and in no acute distress.   MSK: Right shoulder: Normal-appearing Range of motion is intact abduction external and internal rotation.  Pain with abduction is present. Strength intact abduction although that is also painful. Positive Hawkins and Neer's test.  Positive empty can test. Negative Yergason's and speeds test.  Left shoulder: Normal-appearing Range of motion decreased abduction 110 degrees active.  Passive range of motion is full but painful. External rotation is full.  Internal rotation lumbar spine. Strength abduction 4/5.  External rotation 4/5 internal rotation 5/5. Positive Hawkins and Neer's test. Negative Yergason speeds test.  Lab and Radiology Results  Procedure: Real-time Ultrasound Guided Injection of left shoulder glenohumeral joint posterior approach Device: Philips Affiniti 50G Images permanently stored and available for review in PACS Ultrasound evaluation prior to injection reveals intact rotator cuff tendons.  Ultrasound evaluation limited by body habitus.  Generally poor quality images. Verbal informed consent obtained.   Discussed risks and benefits of procedure. Warned about infection, bleeding, hyperglycemia damage to structures among others. Patient expresses understanding and agreement Time-out conducted.   Noted no overlying erythema, induration, or other signs of local infection.   Skin prepped in a sterile fashion.   Local anesthesia: Topical Ethyl chloride.   With sterile technique and under real time ultrasound guidance: 40 mg of Kenalog and 2 mL Marcaine injected into glenohumeral joint. Fluid seen entering the joint capsule.   Completed without difficulty   Pain moderately resolved suggesting accurate placement of the medication.   Advised to call if fevers/chills, erythema, induration, drainage, or persistent bleeding.   Images permanently stored and available for review in the ultrasound unit.  Impression: Technically successful ultrasound guided injection.    Procedure: Real-time Ultrasound Guided Injection of right shoulder glenohumeral joint posterior approach Device: Philips Affiniti 50G Images permanently stored and available for review in PACS Verbal informed consent obtained.  Discussed risks and benefits of procedure. Warned about infection, bleeding, hyperglycemia damage to structures among others. Patient expresses understanding and agreement Time-out conducted.   Noted no overlying erythema, induration, or other signs of local infection.   Skin prepped in a sterile fashion.   Local anesthesia: Topical Ethyl chloride.   With sterile technique and under real time ultrasound guidance: 40 mg of Kenalog and 2 mg of Marcaine injected into glenohumeral joint. Fluid seen entering the joint capsule.   Completed without difficulty   Pain moderately resolved suggesting accurate placement of the medication.   Advised to call if fevers/chills, erythema, induration, drainage, or persistent bleeding.   Images permanently stored and available for review in the ultrasound unit.  Impression:  Technically successful ultrasound guided  injection.   X-ray images bilateral shoulders obtained today personally and independently interpreted  Left shoulder: Mild glenohumeral DJD.  Moderate AC DJD.  No acute fractures.  Right shoulder: Mild glenohumeral DJD.  Moderate AC DJD.  No acute fractures.  Await formal radiology review     Impression and Recommendations:    Assessment and Plan: 52 y.o. female with bilateral shoulder pain left worse than right.  Pain thought to be due to exacerbation of DJD.  I think there is a component of rotator cuff tendinopathy as well.  Is possible she is developing adhesive capsulitis but I think this is less likely.  She had a pretty good response to glenohumeral injection today.  Plan for bilateral injection and physical therapy.  Consider subacromial injection in the future if the glenohumeral injection does not work well enough.  Check back in about 6 weeks.Marland Kitchen  PDMP not reviewed this encounter. Orders Placed This Encounter  Procedures   [place] LIMITED JOINT SPACE STRUCTURES UP BILAT(NO LINKED CHARGES)    Order Specific Question:   Reason for Exam (SYMPTOM  OR DIAGNOSIS REQUIRED)    Answer:   bilateral shoulder pain    Order Specific Question:   Preferred imaging location?    Answer:   [facility]   DG Shoulder Left    Standing Status:   Future    Number of Occurrences:   1    Standing Expiration Date:   12/29/2022    Order Specific Question:   Reason for Exam (SYMPTOM  OR DIAGNOSIS REQUIRED)    Answer:   bilateral shoulder pain    Order Specific Question:   Preferred imaging location?    Answer:   [facility]    Order Specific Question:   Is patient pregnant?    Answer:   No   DG Shoulder Right    Standing Status:   Future    Number of Occurrences:   1    Standing Expiration Date:   11/29/2023    Order Specific Question:   Reason for Exam (SYMPTOM  OR DIAGNOSIS REQUIRED)    Answer:   bilateral shoulder pain     Order Specific Question:   Preferred imaging location?    Answer:   [facility]    Order Specific Question:   Is patient pregnant?    Answer:   No   Ambulatory referral to Physical Therapy    Referral Priority:   Routine    Referral Type:   Physical Medicine    Referral Reason:   Specialty Services Required    Requested Specialty:   Physical Therapy    Number of Visits Requested:   1   No orders of the defined types were placed in this encounter.   Discussed warning signs or symptoms. Please see discharge instructions. Patient expresses understanding.   The above documentation has been reviewed and is accurate and complete Lynne Leader, M.D.

## 2022-11-29 ENCOUNTER — Other Ambulatory Visit (HOSPITAL_COMMUNITY): Payer: Self-pay

## 2022-11-30 ENCOUNTER — Other Ambulatory Visit (HOSPITAL_COMMUNITY): Payer: Self-pay

## 2022-11-30 NOTE — Progress Notes (Signed)
Left shoulder x-ray shows up to medium arthritis of the main shoulder joint.

## 2022-11-30 NOTE — Progress Notes (Signed)
Right shoulder x-ray shows medium arthritis of the main shoulder joint.

## 2022-12-08 ENCOUNTER — Other Ambulatory Visit (HOSPITAL_COMMUNITY): Payer: Self-pay

## 2022-12-12 ENCOUNTER — Other Ambulatory Visit (HOSPITAL_COMMUNITY): Payer: Self-pay

## 2022-12-21 ENCOUNTER — Ambulatory Visit: Payer: PRIVATE HEALTH INSURANCE | Admitting: Physical Therapy

## 2022-12-21 DIAGNOSIS — G8929 Other chronic pain: Secondary | ICD-10-CM

## 2022-12-21 DIAGNOSIS — M25512 Pain in left shoulder: Secondary | ICD-10-CM

## 2022-12-21 DIAGNOSIS — M25511 Pain in right shoulder: Secondary | ICD-10-CM | POA: Diagnosis not present

## 2022-12-21 NOTE — Therapy (Signed)
OUTPATIENT PHYSICAL THERAPY UPPER EXTREMITY EVALUATION   Patient Name: Stephanie Mccall MRN: [id number] DOB:[birth year], 52 y.o., female Today's Date: 12/21/2022  END OF SESSION:  PT End of Session - 12/22/22 1208     Visit Number 1    Number of Visits 16    Date for PT Re-Evaluation 02/15/23    Authorization Type [employer]    PT Start Time 1305    PT Stop Time 1344    PT Time Calculation (min) 39 min    Activity Tolerance Patient tolerated treatment well    Behavior During Therapy WFL for tasks assessed/performed             Past Medical History:  Diagnosis Date   Allergy    SEASONAL   Arthritis    SHOULDERS   Diabetes mellitus without complication ([place])    Hyperlipidemia    Hypertension    Sleep apnea    Past Surgical History:  Procedure Laterality Date   ABDOMINAL HYSTERECTOMY     partial. fibroids   ANKLE SURGERY Left    screws to straighten   CHOLECYSTECTOMY     KNEE ARTHROSCOPY Right    Patient Active Problem List   Diagnosis Date Noted   Controlled type 2 diabetes mellitus without complication, without long-term current use of insulin ([place]) 09/29/2021   Hypertension associated with diabetes ([place]) 09/29/2021   Hyperlipidemia associated with type 2 diabetes mellitus ([place]) 09/29/2021   Morbid obesity ([place]) 09/29/2021    PCP: Tawnya Crook  REFERRING PROVIDER: Lynne Leader  REFERRING DIAG: Bil shoulder pain  THERAPY DIAG:  Chronic left shoulder pain  Chronic right shoulder pain  Rationale for Evaluation and Treatment: Rehabilitation  ONSET DATE:    SUBJECTIVE:                                                                                                                                                                                      SUBJECTIVE STATEMENT:  Pt states pain in bil shoulders. She did have recent injections that have helped. Still having some pain, mostly with reaching, elevation. Prior to injection behind the back was difficult.   Also was having some pain at rest.  Pt is R handed . Works as Quarry manager. States most pain was at night, difficulty sleeping due to pain.   PERTINENT HISTORY: DM, HTN  PAIN:  Are you having pain? Yes: NPRS scale: prior to injection: up to 10/10, now: 1-2/10 Pain location: bil shoulders Pain description: Sore,  Aggravating factors: work duties, reaching, lifting  Relieving factors: rest  PRECAUTIONS: None  WEIGHT BEARING RESTRICTIONS: No  FALLS:  Has patient fallen in last 6 months? No  PLOF: Independent  PATIENT GOALS: decreased pain in shoulders     OBJECTIVE:   DIAGNOSTIC FINDINGS:    PATIENT SURVEYS :    COGNITION: Overall cognitive status: Within functional limits for tasks assessed     SENSATION: WFL  POSTURE: WFL   UPPER EXTREMITY ROM:    Active /Passive ROM Right eval Left eval  Shoulder flexion 135/150 135/150  Shoulder extension    Shoulder abduction    Shoulder adduction    Shoulder internal rotation Mod limitation/pain for behind the back IR Mod limitation/pain for behind the back IR  Shoulder external rotation wfl wfl  Elbow flexion    Elbow extension    Wrist flexion    Wrist extension    Wrist ulnar deviation    Wrist radial deviation    Wrist pronation    Wrist supination    (Blank rows = not tested)  UPPER EXTREMITY MMT:  MMT Right eval Left eval  Shoulder flexion 4- 4-  Shoulder extension    Shoulder abduction 4- 4-  Shoulder adduction    Shoulder internal rotation 4 4  Shoulder external rotation 4 4  Middle trapezius    Lower trapezius    Elbow flexion    Elbow extension    Wrist flexion    Wrist extension    Wrist ulnar deviation    Wrist radial deviation    Wrist pronation    Wrist supination    Grip strength (lbs)    (Blank rows = not tested)   SHOULDER SPECIAL TESTS:   JOINT MOBILITY TESTING: WFL    PALPATION:  Minimal pain to palpate shoulders.    TODAY'S TREATMENT:                                                                                                                                          DATE:   12/21/21:  Ther ex: see below for HEP   PATIENT EDUCATION: Education details: PT POC, Exam findings, HEP,  Person educated: Patient Education method: Explanation, Demonstration, Tactile cues, Verbal cues, and Handouts Education comprehension: verbalized understanding, returned demonstration, verbal cues required, tactile cues required, and needs further education  HOME EXERCISE PROGRAM: Access Code: [facility] URL: [email or web address] Date: 12/21/2022 Prepared by: Lyndee Hensen  Exercises - Supine Shoulder Flexion Extension AAROM with Dowel  - 1-2 x daily - 1 sets - 10 reps - Seated Scapular Retraction  - 2 x daily - 1 sets - 10 reps - Standing Backward Shoulder Rolls  - 2 x daily - 1 sets - 10 reps  ASSESSMENT:  CLINICAL IMPRESSION: Pt presents with primary complaint of increased pain in bil shoulders. She has mild ROM limitations with increased pain, and decreased strength overall. She has difficulty with reaching, lifting carrying, and job duties due to deficits and pain. Pt to benefit from skilled PT to improve. She will benefit from education on HEP as  well as ergonomics for work duties for less pain.   OBJECTIVE IMPAIRMENTS: decreased mobility, decreased ROM, decreased strength, increased muscle spasms, impaired flexibility, impaired UE functional use, improper body mechanics, and pain.   ACTIVITY LIMITATIONS: carrying, lifting, sitting, sleeping, dressing, reach over head, hygiene/grooming, and locomotion level  PARTICIPATION LIMITATIONS: meal prep, cleaning, laundry, driving, shopping, and occupation  PERSONAL FACTORS: none are also affecting patient's functional outcome.   REHAB POTENTIAL: Good  CLINICAL DECISION MAKING: Stable/uncomplicated  EVALUATION COMPLEXITY: Low   GOALS: Goals reviewed with patient? Yes  SHORT TERM GOALS: Target date:  01/04/2023  Pt to be independent with initial HEP  Goal status: INITIAL    LONG TERM GOALS: Target date: 02/15/2023  Pt to be independent with final HEP  Goal status: INITIAL  2.  Pt to report decreased pain in L shoulder to 0-2/10 with activity, work duties and sleeping.   Goal status: INITIAL  3.  Pt to demo bil shoulder AROM to be [facility] and non painful, to improve ability for ADLS.   Goal status: INITIAL  4.  Pt to demo improved strength of bil shoulders to at least 4+/5 to improve ability for reach, lift, and IADLS. .   Goal status: INITIAL    PLAN: PT FREQUENCY: 1-2x/week  PT DURATION: 8 weeks  PLANNED INTERVENTIONS: Therapeutic exercises, Therapeutic activity, Neuromuscular re-education, Patient/Family education, Self Care, Joint mobilization, Joint manipulation, Aquatic Therapy, Dry Needling, Electrical stimulation, Spinal manipulation, Spinal mobilization, Cryotherapy, Moist heat, Taping, Vasopneumatic device, Traction, Ultrasound, Ionotophoresis 72m/ml Dexamethasone, and Manual therapy  PLAN FOR NEXT SESSION:   LLyndee Hensen PT, DPT 10:50 AM  12/21/22

## 2022-12-22 ENCOUNTER — Encounter: Payer: Self-pay | Admitting: Physical Therapy

## 2022-12-25 ENCOUNTER — Other Ambulatory Visit (HOSPITAL_COMMUNITY): Payer: Self-pay

## 2022-12-25 ENCOUNTER — Other Ambulatory Visit: Payer: Self-pay

## 2022-12-26 ENCOUNTER — Ambulatory Visit: Payer: PRIVATE HEALTH INSURANCE | Admitting: Physical Therapy

## 2022-12-26 ENCOUNTER — Encounter: Payer: Self-pay | Admitting: Physical Therapy

## 2022-12-26 DIAGNOSIS — M25511 Pain in right shoulder: Secondary | ICD-10-CM

## 2022-12-26 DIAGNOSIS — G8929 Other chronic pain: Secondary | ICD-10-CM

## 2022-12-26 DIAGNOSIS — M25512 Pain in left shoulder: Secondary | ICD-10-CM

## 2022-12-26 NOTE — Therapy (Signed)
OUTPATIENT PHYSICAL THERAPY UPPER EXTREMITY TREATMENT   Patient Name: Stephanie Mccall MRN: [id number] DOB:Nov 29, 1970, 52 y.o., female Today's Date: 12/26/2022  END OF SESSION:  PT End of Session - 12/26/22 1219     Visit Number 2    Number of Visits 16    Date for PT Re-Evaluation 02/15/23    Authorization Type [employer]    PT Start Time [id number]    PT Stop Time 1300    PT Time Calculation (min) 40 min    Activity Tolerance Patient tolerated treatment well    Behavior During Therapy WFL for tasks assessed/performed             Past Medical History:  Diagnosis Date   Allergy    SEASONAL   Arthritis    SHOULDERS   Diabetes mellitus without complication ([place])    Hyperlipidemia    Hypertension    Sleep apnea    Past Surgical History:  Procedure Laterality Date   ABDOMINAL HYSTERECTOMY     partial. fibroids   ANKLE SURGERY Left    screws to straighten   CHOLECYSTECTOMY     KNEE ARTHROSCOPY Right    Patient Active Problem List   Diagnosis Date Noted   Controlled type 2 diabetes mellitus without complication, without long-term current use of insulin ([place]) 09/29/2021   Hypertension associated with diabetes ([place]) 09/29/2021   Hyperlipidemia associated with type 2 diabetes mellitus ([place]) 09/29/2021   Morbid obesity ([place]) 09/29/2021    PCP: Tawnya Crook  REFERRING PROVIDER: Lynne Leader  REFERRING DIAG: Bil shoulder pain  THERAPY DIAG:  Chronic left shoulder pain  Chronic right shoulder pain  Rationale for Evaluation and Treatment: Rehabilitation  ONSET DATE:    SUBJECTIVE:                                                                                                                                                                                      SUBJECTIVE STATEMENT: Pt states soreness in L shoulder.  Eval: Pt states pain in bil shoulders. She did have recent injections that have helped. Still having some pain, mostly with reaching, elevation. Prior to  injection behind the back was difficult.  Also was having some pain at rest.  Pt is R handed . Works as Quarry manager. States most pain was at night, difficulty sleeping due to pain.   PERTINENT HISTORY: DM, HTN  PAIN:  Are you having pain? Yes: NPRS scale: prior to injection: up to 10/10, now: 1-2/10 Pain location: bil shoulders Pain description: Sore,  Aggravating factors: work duties, reaching, lifting  Relieving factors: rest  PRECAUTIONS: None  WEIGHT BEARING RESTRICTIONS: No  FALLS:  Has  patient fallen in last 6 months? No  PLOF: Independent  PATIENT GOALS: decreased pain in shoulders     OBJECTIVE:   DIAGNOSTIC FINDINGS:   PATIENT SURVEYS :   COGNITION: Overall cognitive status: Within functional limits for tasks assessed     SENSATION: WFL  POSTURE: WFL   UPPER EXTREMITY ROM:    Active /Passive ROM Right eval Left eval  Shoulder flexion 135/150 135/150  Shoulder extension    Shoulder abduction    Shoulder adduction    Shoulder internal rotation Mod limitation/pain for behind the back IR Mod limitation/pain for behind the back IR  Shoulder external rotation wfl wfl  Elbow flexion    Elbow extension    Wrist flexion    Wrist extension    Wrist ulnar deviation    Wrist radial deviation    Wrist pronation    Wrist supination    (Blank rows = not tested)  UPPER EXTREMITY MMT:  MMT Right eval Left eval  Shoulder flexion 4- 4-  Shoulder extension    Shoulder abduction 4- 4-  Shoulder adduction    Shoulder internal rotation 4 4  Shoulder external rotation 4 4  Middle trapezius    Lower trapezius    Elbow flexion    Elbow extension    Wrist flexion    Wrist extension    Wrist ulnar deviation    Wrist radial deviation    Wrist pronation    Wrist supination    Grip strength (lbs)    (Blank rows = not tested)   SHOULDER SPECIAL TESTS:   JOINT MOBILITY TESTING: WFL    PALPATION:  Minimal pain to palpate shoulders.    TODAY'S  TREATMENT:                                                                                                                                         DATE:   12/26/22: Therapeutic Exercise: Aerobic: Supine:  Flexion/AAROm with dowel 90-140 deg x 15; ER butterfly x 10,  Seated: Standing: Scap squeeze x 10; shoulder rolls x 10; shoulder ER ROM 2x 10; with YTB 2 x 10;  Stretches: L pec stretch with nerve glide  x15;  Neuromuscular Re-education: Manual Therapy: Therapeutic Activity: Self Care:   PATIENT EDUCATION: Education details: reviewed HEP.  Person educated: Patient Education method: Explanation, Demonstration, Tactile cues, Verbal cues, and Handouts Education comprehension: verbalized understanding, returned demonstration, verbal cues required, tactile cues required, and needs further education  HOME EXERCISE PROGRAM: Access Code: [place]    ASSESSMENT:  CLINICAL IMPRESSION: 12/26/2022 Pt with soreness at end range of flex and ER, but improved ROM for flexion today. Pt with good ability for light strengthening today, without increased pain. Continued education on not over doing activity at work, to rest shoulder, and also keeping HEP pain free.   Eval: Pt presents with primary complaint of increased pain in bil shoulders.  She has mild ROM limitations with increased pain, and decreased strength overall. She has difficulty with reaching, lifting carrying, and job duties due to deficits and pain. Pt to benefit from skilled PT to improve. She will benefit from education on HEP as well as ergonomics for work duties for less pain.   OBJECTIVE IMPAIRMENTS: decreased mobility, decreased ROM, decreased strength, increased muscle spasms, impaired flexibility, impaired UE functional use, improper body mechanics, and pain.   ACTIVITY LIMITATIONS: carrying, lifting, sitting, sleeping, dressing, reach over head, hygiene/grooming, and locomotion level  PARTICIPATION LIMITATIONS: meal prep,  cleaning, laundry, driving, shopping, and occupation  PERSONAL FACTORS: none are also affecting patient's functional outcome.   REHAB POTENTIAL: Good  CLINICAL DECISION MAKING: Stable/uncomplicated  EVALUATION COMPLEXITY: Low   GOALS: Goals reviewed with patient? Yes  SHORT TERM GOALS: Target date: 01/04/2023  Pt to be independent with initial HEP  Goal status: INITIAL   LONG TERM GOALS: Target date: 02/15/2023  Pt to be independent with final HEP  Goal status: INITIAL  2.  Pt to report decreased pain in L shoulder to 0-2/10 with activity, work duties and sleeping.   Goal status: INITIAL  3.  Pt to demo bil shoulder AROM to be [facility] and non painful, to improve ability for ADLS.   Goal status: INITIAL  4.  Pt to demo improved strength of bil shoulders to at least 4+/5 to improve ability for reach, lift, and IADLS. .   Goal status: INITIAL   PLAN: PT FREQUENCY: 1-2x/week  PT DURATION: 8 weeks  PLANNED INTERVENTIONS: Therapeutic exercises, Therapeutic activity, Neuromuscular re-education, Patient/Family education, Self Care, Joint mobilization, Joint manipulation, Aquatic Therapy, Dry Needling, Electrical stimulation, Spinal manipulation, Spinal mobilization, Cryotherapy, Moist heat, Taping, Vasopneumatic device, Traction, Ultrasound, Ionotophoresis 47m/ml Dexamethasone, and Manual therapy  PLAN FOR NEXT SESSION:   LLyndee Hensen PT, DPT 10:50 AM  12/21/22

## 2022-12-29 ENCOUNTER — Other Ambulatory Visit (HOSPITAL_COMMUNITY): Payer: Self-pay

## 2023-01-02 ENCOUNTER — Encounter: Payer: Self-pay | Admitting: Family Medicine

## 2023-01-02 ENCOUNTER — Ambulatory Visit: Payer: PRIVATE HEALTH INSURANCE | Admitting: Family Medicine

## 2023-01-02 VITALS — BP 120/70 | HR 80 | Temp 98.0°F | Ht 66.0 in | Wt 254.0 lb

## 2023-01-02 DIAGNOSIS — E1169 Type 2 diabetes mellitus with other specified complication: Secondary | ICD-10-CM

## 2023-01-02 DIAGNOSIS — I152 Hypertension secondary to endocrine disorders: Secondary | ICD-10-CM

## 2023-01-02 DIAGNOSIS — L219 Seborrheic dermatitis, unspecified: Secondary | ICD-10-CM | POA: Diagnosis not present

## 2023-01-02 DIAGNOSIS — E1159 Type 2 diabetes mellitus with other circulatory complications: Secondary | ICD-10-CM | POA: Diagnosis not present

## 2023-01-02 DIAGNOSIS — E119 Type 2 diabetes mellitus without complications: Secondary | ICD-10-CM

## 2023-01-02 DIAGNOSIS — E785 Hyperlipidemia, unspecified: Secondary | ICD-10-CM

## 2023-01-02 MED ORDER — PRAVASTATIN SODIUM 20 MG PO TABS: 20.0000 mg | ORAL_TABLET | Freq: Every day | ORAL | 0 refills | Status: DC

## 2023-01-02 MED ORDER — IBUPROFEN 800 MG PO TABS: 800.0000 mg | ORAL_TABLET | Freq: Three times a day (TID) | ORAL | 3 refills | Status: DC | PRN

## 2023-01-02 MED ORDER — KETOCONAZOLE 2 % EX CREA: 1.0000 | TOPICAL_CREAM | Freq: Every day | CUTANEOUS | 1 refills | Status: DC

## 2023-01-02 MED ORDER — AMLODIPINE BESYLATE 10 MG PO TABS: 10.0000 mg | ORAL_TABLET | Freq: Every day | ORAL | 3 refills | Status: DC

## 2023-01-02 MED ORDER — KETOCONAZOLE 2 % EX SHAM: 1.0000 | MEDICATED_SHAMPOO | CUTANEOUS | 3 refills | Status: DC

## 2023-01-02 MED ORDER — TRIAMCINOLONE ACETONIDE 0.1 % EX CREA: 1.0000 | TOPICAL_CREAM | Freq: Two times a day (BID) | CUTANEOUS | 0 refills | Status: DC

## 2023-01-02 MED ORDER — GLIPIZIDE ER 2.5 MG PO TB24: 2.5000 mg | ORAL_TABLET | Freq: Every day | ORAL | 1 refills | Status: DC

## 2023-01-02 MED ORDER — LOSARTAN POTASSIUM-HCTZ 100-25 MG PO TABS: 1.0000 | ORAL_TABLET | Freq: Every day | ORAL | 3 refills | Status: DC

## 2023-01-02 MED ORDER — METFORMIN HCL 1000 MG PO TABS: 1000.0000 mg | ORAL_TABLET | Freq: Two times a day (BID) | ORAL | 1 refills | Status: DC

## 2023-01-02 MED ORDER — OZEMPIC (2 MG/DOSE) 8 MG/3ML ~~LOC~~ SOPN: 2.0000 mg | PEN_INJECTOR | SUBCUTANEOUS | 3 refills | Status: DC

## 2023-01-02 NOTE — Patient Instructions (Signed)
It was very nice to see you today!  Nizoral shampoo and cream for face/scalp   PLEASE NOTE:  If you had any lab tests please let us know if you have not heard back within a few days. You may see your results on MyChart before we have a chance to review them but we will give you a call once they are reviewed by [place]. If we ordered any referrals today, please let us know if you have not heard from their office within the next week.   Please try these tips to maintain a healthy lifestyle:  Eat most of your calories during the day when you are active. Eliminate processed foods including packaged sweets (pies, cakes, cookies), reduce intake of potatoes, white bread, white pasta, and white rice. Look for whole grain options, oat flour or almond flour.  Each meal should contain half fruits/vegetables, one quarter protein, and one quarter carbs (no bigger than a computer mouse).  Cut down on sweet beverages. This includes juice, soda, and sweet tea. Also watch fruit intake, though this is a healthier sweet option, it still contains natural sugar! Limit to 3 servings daily.  Drink at least 1 glass of water with each meal and aim for at least 8 glasses per day  Exercise at least 150 minutes every week.

## 2023-01-02 NOTE — Progress Notes (Signed)
Subjective:     Patient ID: Stephanie Mccall, female    DOB: 11-09-1971, 52 y.o.   MRN: [id number]  Chief Complaint  Patient presents with   Follow-up    6 month follow-up Face dry and raw feeling Pubic pain last night     HPI DM-on glipizide 2.5 and metformin 1000bid and ozempic 53m.   Occ constipationOn pravastatin 259m  Sugars 120-136.  Rare 140. HTN-Pt is on losartan hct and amlodipine .  Bp's running 130-150/70-80's.  No ha/dizziness/cp/palp/edema/cough/sob  Face dry and feels raw-since covid-red and raw around nasal folds and flakey. Some in scalp as well.  Last week-pain middle of chest to move and rad next day to mid back and then ok by 3rd day.  No other symptoms.  No uterus.  One day after urinating last month, some pink on tissue.  Not since.  No dysuria. And had some pelvic pain-lasted several days.  Fine since. Tdap thinks 2022.   Health Maintenance Due  Topic Date Due   DTaP/Tdap/Td (1 - Tdap) Never done   HEMOGLOBIN A1C  12/29/2022    Past Medical History:  Diagnosis Date   Allergy    SEASONAL   Arthritis    SHOULDERS   Diabetes mellitus without complication ([facility]   Hyperlipidemia    Hypertension    Sleep apnea     Past Surgical History:  Procedure Laterality Date   ABDOMINAL HYSTERECTOMY     partial. fibroids   ANKLE SURGERY Left    screws to straighten   CHOLECYSTECTOMY     KNEE ARTHROSCOPY Right     Outpatient Medications Prior to Visit  Medication Sig Dispense Refill   Accu-Chek Softclix Lancets lancets Test up to twice daily as directed in the morning fasting and when hypoglycemic 100 each 1   Acetaminophen (TYLENOL PO) Take by mouth as needed.     aspirin EC 81 MG tablet Take 1 tablet (81 mg total) by mouth daily. 30 tablet 3   Aspirin-Acetaminophen-Caffeine (EXCEDRIN PO) Take by mouth as needed.     Blood Glucose Monitoring Suppl (BLOOD GLUCOSE MONITOR SYSTEM) w/Device KIT Test up to twice daily as directed in the morning fasting and when  hypoglycemic 1 kit 0   glucose blood test strip Test up to twice daily as directed in the morning fasting and when hypoglycemic 100 each 1   hydrOXYzine (ATARAX) 10 MG tablet Take 1-3 tablets (10-30 mg total) by mouth every 4 - 6  hours as needed. 30 tablet 0   lidocaine (LIDODERM) 5 % Place 1 patch onto the skin daily. Remove & Discard patch within 12 hours or as directed. 30 patch 3   amLODipine (NORVASC) 10 MG tablet Take 1 tablet (10 mg total) by mouth daily. 90 tablet 3   glipiZIDE (GLUCOTROL XL) 2.5 MG 24 hr tablet Take 1 tablet (2.5 mg total) by mouth daily. 90 tablet 1   ibuprofen (ADVIL) 800 MG tablet Take 1 tablet (800 mg total) by mouth every 8 (eight) hours as needed for mild to moderate pain for up to 10 days 30 tablet 0   losartan-hydrochlorothiazide (HYZAAR) 100-25 MG tablet Take 1 tablet by mouth daily. 90 tablet 3   metFORMIN (GLUCOPHAGE) 1000 MG tablet Take 1 tablet (1,000 mg total) by mouth 2 (two) times daily. 180 tablet 1   pravastatin (PRAVACHOL) 20 MG tablet Take 1 tablet (20 mg total) by mouth daily. 90 tablet 0   Semaglutide, 1 MG/DOSE, (OZEMPIC, 1 MG/DOSE,) 4 MG/3ML  SOPN Inject 1 mg into the skin once a week. 3 mL 1   No facility-administered medications prior to visit.    Allergies  Allergen Reactions   Nitroglycerin Itching   ROS neg/noncontributory except as noted HPI/below      Objective:     BP 120/70   Pulse 80   Temp 98 F (36.7 C) (Temporal)   Ht 5' 6"$  (1.676 m)   Wt 254 lb (115.2 kg)   SpO2 99%   BMI 41.00 kg/m  Wt Readings from Last 3 Encounters:  01/02/23 254 lb (115.2 kg)  11/28/22 258 lb (117 kg)  10/23/22 257 lb (116.6 kg)    Physical Exam   Gen: WDWN NAD HEENT: NCAT, conjunctiva not injected, sclera nonicteric NECK:  supple, no thyromegaly, no nodes, no carotid bruits CARDIAC: RRR, S1S2+, no murmur. DP 2+B LUNGS: CTAB. No wheezes ABDOMEN:  BS+, soft, NTND, No HSM, no masses EXT:  no edema MSK: no gross abnormalities.  NEURO:  A&O x3.  CN II-XII intact.  PSYCH: normal mood. Good eye contact Face dry and bumpy  Diabetic Foot Exam - Simple   Simple Foot Form Diabetic Foot exam was performed with the following findings: Yes 01/02/2023  3:26 PM  Visual Inspection See comments: Yes Sensation Testing Intact to touch and monofilament testing bilaterally: Yes Pulse Check Posterior Tibialis and Dorsalis pulse intact bilaterally: Yes Comments Hammar toes L>R w/some callus dorsum         Assessment & Plan:   Problem List Items Addressed This Visit       Cardiovascular and Mediastinum   Hypertension associated with diabetes ([place])   Relevant Medications   glipiZIDE (GLUCOTROL XL) 2.5 MG 24 hr tablet   amLODipine (NORVASC) 10 MG tablet   losartan-hydrochlorothiazide (HYZAAR) 100-25 MG tablet   metFORMIN (GLUCOPHAGE) 1000 MG tablet   pravastatin (PRAVACHOL) 20 MG tablet   Semaglutide, 2 MG/DOSE, (OZEMPIC, 2 MG/DOSE,) 8 MG/3ML SOPN   Other Relevant Orders   CBC   Comprehensive metabolic panel     Endocrine   Controlled type 2 diabetes mellitus without complication, without long-term current use of insulin ([facility]) - Primary   Relevant Medications   glipiZIDE (GLUCOTROL XL) 2.5 MG 24 hr tablet   losartan-hydrochlorothiazide (HYZAAR) 100-25 MG tablet   metFORMIN (GLUCOPHAGE) 1000 MG tablet   pravastatin (PRAVACHOL) 20 MG tablet   Semaglutide, 2 MG/DOSE, (OZEMPIC, 2 MG/DOSE,) 8 MG/3ML SOPN   Other Relevant Orders   Comprehensive metabolic panel   Hemoglobin A1c   Hyperlipidemia associated with type 2 diabetes mellitus ([facility])   Relevant Medications   glipiZIDE (GLUCOTROL XL) 2.5 MG 24 hr tablet   amLODipine (NORVASC) 10 MG tablet   losartan-hydrochlorothiazide (HYZAAR) 100-25 MG tablet   metFORMIN (GLUCOPHAGE) 1000 MG tablet   pravastatin (PRAVACHOL) 20 MG tablet   Semaglutide, 2 MG/DOSE, (OZEMPIC, 2 MG/DOSE,) 8 MG/3ML SOPN   Other Relevant Orders   Comprehensive metabolic panel   Lipid panel   Other  Visit Diagnoses     Seborrheic dermatitis          DM type 2-chronic.  Well controlled.  Cont glipizide 2.30m, metformin 1000bid and increase ozempic to 265m  Return for labs.  Foot exam done.   HTN-chronic.  Controlled.  Cont hyzaar 100/25/amlodipine 1039m Check cmp,cbc HLD-chronic.  Controlled.  Cont pravastatin 20g.  Return for lipids,cmp Seb Derm-nizoral shampoo and cream to scalp and face.   Meds ordered this encounter  Medications   glipiZIDE (GLUCOTROL XL) 2.5 MG  24 hr tablet    Sig: Take 1 tablet (2.5 mg total) by mouth daily.    Dispense:  90 tablet    Refill:  1   amLODipine (NORVASC) 10 MG tablet    Sig: Take 1 tablet (10 mg total) by mouth daily.    Dispense:  90 tablet    Refill:  3   losartan-hydrochlorothiazide (HYZAAR) 100-25 MG tablet    Sig: Take 1 tablet by mouth daily.    Dispense:  90 tablet    Refill:  3   metFORMIN (GLUCOPHAGE) 1000 MG tablet    Sig: Take 1 tablet (1,000 mg total) by mouth 2 (two) times daily.    Dispense:  180 tablet    Refill:  1   pravastatin (PRAVACHOL) 20 MG tablet    Sig: Take 1 tablet (20 mg total) by mouth daily.    Dispense:  90 tablet    Refill:  0   ibuprofen (ADVIL) 800 MG tablet    Sig: Take 1 tablet (800 mg total) by mouth every 8 (eight) hours as needed for mild to moderate pain for up to 10 days    Dispense:  30 tablet    Refill:  3   triamcinolone cream (KENALOG) 0.1 %    Sig: Apply 1 Application topically 2 (two) times daily. For rash    Dispense:  30 g    Refill:  0   ketoconazole (NIZORAL) 2 % shampoo    Sig: Apply 1 Application topically 2 (two) times a week.    Dispense:  120 mL    Refill:  3   ketoconazole (NIZORAL) 2 % cream    Sig: Apply 1 Application topically daily. Face/scalp rash    Dispense:  15 g    Refill:  1   Semaglutide, 2 MG/DOSE, (OZEMPIC, 2 MG/DOSE,) 8 MG/3ML SOPN    Sig: Inject 2 mg into the skin once a week.    Dispense:  9 mL    Refill:  3    Wellington Hampshire, MD

## 2023-01-04 ENCOUNTER — Encounter: Payer: PRIVATE HEALTH INSURANCE | Admitting: Physical Therapy

## 2023-01-09 ENCOUNTER — Ambulatory Visit: Payer: PRIVATE HEALTH INSURANCE | Admitting: Physical Therapy

## 2023-01-09 ENCOUNTER — Encounter: Payer: Self-pay | Admitting: Physical Therapy

## 2023-01-09 ENCOUNTER — Other Ambulatory Visit (INDEPENDENT_AMBULATORY_CARE_PROVIDER_SITE_OTHER): Payer: PRIVATE HEALTH INSURANCE

## 2023-01-09 DIAGNOSIS — I152 Hypertension secondary to endocrine disorders: Secondary | ICD-10-CM | POA: Diagnosis not present

## 2023-01-09 DIAGNOSIS — E1169 Type 2 diabetes mellitus with other specified complication: Secondary | ICD-10-CM

## 2023-01-09 DIAGNOSIS — E785 Hyperlipidemia, unspecified: Secondary | ICD-10-CM | POA: Diagnosis not present

## 2023-01-09 DIAGNOSIS — E119 Type 2 diabetes mellitus without complications: Secondary | ICD-10-CM

## 2023-01-09 DIAGNOSIS — E1159 Type 2 diabetes mellitus with other circulatory complications: Secondary | ICD-10-CM

## 2023-01-09 DIAGNOSIS — G8929 Other chronic pain: Secondary | ICD-10-CM

## 2023-01-09 DIAGNOSIS — M25511 Pain in right shoulder: Secondary | ICD-10-CM | POA: Diagnosis not present

## 2023-01-09 DIAGNOSIS — M25512 Pain in left shoulder: Secondary | ICD-10-CM | POA: Diagnosis not present

## 2023-01-09 LAB — HEMOGLOBIN A1C: Hgb A1c MFr Bld: 7 % — ABNORMAL HIGH (ref 4.6–6.5)

## 2023-01-09 LAB — LIPID PANEL
Cholesterol: 233 mg/dL — ABNORMAL HIGH (ref 0–200)
HDL: 57.6 mg/dL (ref >39.00)
LDL Cholesterol: 151 mg/dL — ABNORMAL HIGH (ref 0–99)
NonHDL: 175.31
Total CHOL/HDL Ratio: 4
Triglycerides: 123 mg/dL (ref 0.0–149.0)
VLDL: 24.6 mg/dL (ref 0.0–40.0)

## 2023-01-09 LAB — COMPREHENSIVE METABOLIC PANEL
ALT: 19 U/L (ref 0–35)
AST: 17 U/L (ref 0–37)
Albumin: 4.5 g/dL (ref 3.5–5.2)
Alkaline Phosphatase: 69 U/L (ref 39–117)
BUN: 16 mg/dL (ref 6–23)
CO2: 27 mEq/L (ref 19–32)
Calcium: 10.5 mg/dL (ref 8.4–10.5)
Chloride: 102 mEq/L (ref 96–112)
Creatinine, Ser: 0.7 mg/dL (ref 0.40–1.20)
GFR: 99.17 mL/min (ref >60.00)
Glucose, Bld: 104 mg/dL — ABNORMAL HIGH (ref 70–99)
Potassium: 3.7 mEq/L (ref 3.5–5.1)
Sodium: 140 mEq/L (ref 135–145)
Total Bilirubin: 0.4 mg/dL (ref 0.2–1.2)
Total Protein: 8 g/dL (ref 6.0–8.3)

## 2023-01-09 LAB — CBC
HCT: 45.5 % (ref 36.0–46.0)
Hemoglobin: 14.9 g/dL (ref 12.0–15.0)
MCHC: 32.7 g/dL (ref 30.0–36.0)
MCV: 85.5 fl (ref 78.0–100.0)
Platelets: 284 10*3/uL (ref 150.0–400.0)
RBC: 5.32 Mil/uL — ABNORMAL HIGH (ref 3.87–5.11)
RDW: 14.4 % (ref 11.5–15.5)
WBC: 3.8 10*3/uL — ABNORMAL LOW (ref 4.0–10.5)

## 2023-01-09 NOTE — Therapy (Addendum)
OUTPATIENT PHYSICAL THERAPY UPPER EXTREMITY TREATMENT   Patient Name: Stephanie Mccall MRN: [id number] DOB:1971-10-25, 52 y.o., female Today's Date: 01/09/2023  END OF SESSION:  PT End of Session - 01/09/23 1011     Visit Number 3    Number of Visits 16    Date for PT Re-Evaluation 02/15/23    Authorization Type [employer]    PT Start Time 0933    PT Stop Time 1011    PT Time Calculation (min) 38 min    Activity Tolerance Patient tolerated treatment well    Behavior During Therapy WFL for tasks assessed/performed              Past Medical History:  Diagnosis Date   Allergy    SEASONAL   Arthritis    SHOULDERS   Diabetes mellitus without complication ([facility])    Hyperlipidemia    Hypertension    Sleep apnea    Past Surgical History:  Procedure Laterality Date   ABDOMINAL HYSTERECTOMY     partial. fibroids   ANKLE SURGERY Left    screws to straighten   CHOLECYSTECTOMY     KNEE ARTHROSCOPY Right    Patient Active Problem List   Diagnosis Date Noted   Controlled type 2 diabetes mellitus without complication, without long-term current use of insulin ([facility]) 09/29/2021   Hypertension associated with diabetes ([facility]) 09/29/2021   Hyperlipidemia associated with type 2 diabetes mellitus ([facility]) 09/29/2021   Morbid obesity ([facility]) 09/29/2021    PCP: Jeani Sow  REFERRING PROVIDER: Clementeen Graham  REFERRING DIAG: Bil shoulder pain  THERAPY DIAG:  Chronic left shoulder pain  Chronic right shoulder pain  Rationale for Evaluation and Treatment: Rehabilitation  ONSET DATE:    SUBJECTIVE:                                                                                                                                                                                      SUBJECTIVE STATEMENT: Pt states soreness in L shoulder. Has been more sore this week with elevation. She does report sleeping better.   Eval: Pt states pain in bil shoulders. She did have recent injections  that have helped. Still having some pain, mostly with reaching, elevation. Prior to injection behind the back was difficult.  Also was having some pain at rest.  Pt is R handed . Works as Lawyer. States most pain was at night, difficulty sleeping due to pain.   PERTINENT HISTORY: DM, HTN  PAIN:  Are you having pain? Yes: NPRS scale: prior to injection: up to 10/10, now: 1-2/10 Pain location: bil shoulders Pain description: Sore,  Aggravating factors: work duties, reaching, lifting  Relieving factors: rest  PRECAUTIONS: None  WEIGHT BEARING RESTRICTIONS: No  FALLS:  Has patient fallen in last 6 months? No  PLOF: Independent  PATIENT GOALS: decreased pain in shoulders     OBJECTIVE:   DIAGNOSTIC FINDINGS:   PATIENT SURVEYS :   COGNITION: Overall cognitive status: Within functional limits for tasks assessed     SENSATION: WFL  POSTURE: WFL   UPPER EXTREMITY ROM:    Active /Passive ROM Right eval Left eval  Shoulder flexion 135/150 135/150  Shoulder extension    Shoulder abduction    Shoulder adduction    Shoulder internal rotation Mod limitation/pain for behind the back IR Mod limitation/pain for behind the back IR  Shoulder external rotation wfl wfl  Elbow flexion    Elbow extension    Wrist flexion    Wrist extension    Wrist ulnar deviation    Wrist radial deviation    Wrist pronation    Wrist supination    (Blank rows = not tested)  UPPER EXTREMITY MMT:  MMT Right eval Left eval  Shoulder flexion 4- 4-  Shoulder extension    Shoulder abduction 4- 4-  Shoulder adduction    Shoulder internal rotation 4 4  Shoulder external rotation 4 4  Middle trapezius    Lower trapezius    Elbow flexion    Elbow extension    Wrist flexion    Wrist extension    Wrist ulnar deviation    Wrist radial deviation    Wrist pronation    Wrist supination    Grip strength (lbs)    (Blank rows = not tested)   SHOULDER SPECIAL TESTS:   JOINT MOBILITY  TESTING: WFL    PALPATION:  Minimal pain to palpate shoulders.    TODAY'S TREATMENT:                                                                                                                                         DATE:   01/09/23: Therapeutic Exercise: Aerobic: Supine:  Flexion/AROM x 15;  Horiz abd 2 x 10; SA presses 2 x 10  S/L:  ER 2 lb 2x10 on L;  Seated: Pulley flexion x 3 min;  Standing: Scap squeeze x 10; shoulder ER with YTB 2 x 10(towel under arm) ; Wall slides 2 hands x 10,  Circles x 10 ea way on L, single arm, with attention to shoulder posture;  Stretches: L pec stretch with nerve glide  x15;  Neuromuscular Re-education: Manual Therapy: PROM for L shoulder, all motions, LAD, manually resisted IR/ER x 15;    PATIENT EDUCATION: Education details: reviewed, updated  HEP.  Person educated: Patient Education method: Explanation, Demonstration, Tactile cues, Verbal cues, and Handouts Education comprehension: verbalized understanding, returned demonstration, verbal cues required, tactile cues required, and needs further education  HOME EXERCISE PROGRAM: Access Code: [facility]    ASSESSMENT:  CLINICAL  IMPRESSION: 01/09/2023  Pt with improving ROM for PROM and AAROM. No pain with exercises today. She does have pain with active elevation in standing. Will progress strengthening as tolerated. Pt with good ability for ER strengthening today. Education on optimal shoulder posture and scapular stabilization with most exercises today.   Eval: Pt presents with primary complaint of increased pain in bil shoulders. She has mild ROM limitations with increased pain, and decreased strength overall. She has difficulty with reaching, lifting carrying, and job duties due to deficits and pain. Pt to benefit from skilled PT to improve. She will benefit from education on HEP as well as ergonomics for work duties for less pain.   OBJECTIVE IMPAIRMENTS: decreased mobility, decreased ROM,  decreased strength, increased muscle spasms, impaired flexibility, impaired UE functional use, improper body mechanics, and pain.   ACTIVITY LIMITATIONS: carrying, lifting, sitting, sleeping, dressing, reach over head, hygiene/grooming, and locomotion level  PARTICIPATION LIMITATIONS: meal prep, cleaning, laundry, driving, shopping, and occupation  PERSONAL FACTORS: none are also affecting patient's functional outcome.   REHAB POTENTIAL: Good  CLINICAL DECISION MAKING: Stable/uncomplicated  EVALUATION COMPLEXITY: Low   GOALS: Goals reviewed with patient? Yes  SHORT TERM GOALS: Target date: 01/04/2023  Pt to be independent with initial HEP  Goal status: INITIAL   LONG TERM GOALS: Target date: 02/15/2023  Pt to be independent with final HEP  Goal status: INITIAL  2.  Pt to report decreased pain in L shoulder to 0-2/10 with activity, work duties and sleeping.   Goal status: INITIAL  3.  Pt to demo bil shoulder AROM to be [facility] and non painful, to improve ability for ADLS.   Goal status: INITIAL  4.  Pt to demo improved strength of bil shoulders to at least 4+/5 to improve ability for reach, lift, and IADLS. .   Goal status: INITIAL   PLAN: PT FREQUENCY: 1-2x/week  PT DURATION: 8 weeks  PLANNED INTERVENTIONS: Therapeutic exercises, Therapeutic activity, Neuromuscular re-education, Patient/Family education, Self Care, Joint mobilization, Joint manipulation, Aquatic Therapy, Dry Needling, Electrical stimulation, Spinal manipulation, Spinal mobilization, Cryotherapy, Moist heat, Taping, Vasopneumatic device, Traction, Ultrasound, Ionotophoresis 4mg /ml Dexamethasone, and Manual therapy  PLAN FOR NEXT SESSION:   Sedalia Muta, PT, DPT 10:50 AM  12/21/22  PHYSICAL THERAPY DISCHARGE SUMMARY  Visits from Start of Care: 3   Plan: Patient agrees to discharge.  Patient goals were partially met. Patient is being discharged due to - Pt did not return after last visit.     Sedalia Muta, PT, DPT 1:32 PM  06/14/23

## 2023-01-13 NOTE — Progress Notes (Signed)
1.  Sugars are much better-keep up the great work and hopefully the 2 mg Ozempic will get [place] to goal of less than 7. 2.  WBC slightly decreased-just something we need to monitor 3.  Cholesterol is too high-increase pravastatin to 80 mg daily (send in new prescription)

## 2023-01-15 ENCOUNTER — Other Ambulatory Visit: Payer: Self-pay

## 2023-01-15 ENCOUNTER — Other Ambulatory Visit (HOSPITAL_COMMUNITY): Payer: Self-pay

## 2023-01-15 MED ORDER — PRAVASTATIN SODIUM 80 MG PO TABS
80.0000 mg | ORAL_TABLET | Freq: Every day | ORAL | 3 refills | Status: DC
  Filled 2023-01-15: qty 30, 30d supply, fill #0
  Filled 2023-03-29: qty 30, 30d supply, fill #1
  Filled 2023-04-25 – 2023-04-26 (×2): qty 30, 30d supply, fill #2
  Filled 2023-05-27: qty 30, 30d supply, fill #3
  Filled 2023-06-30: qty 30, 30d supply, fill #4
  Filled 2023-07-31: qty 30, 30d supply, fill #5
  Filled 2023-08-30: qty 30, 30d supply, fill #6
  Filled 2023-10-04: qty 30, 30d supply, fill #7
  Filled 2023-10-30: qty 30, 30d supply, fill #8
  Filled 2023-11-29: qty 30, 30d supply, fill #9
  Filled 2023-12-31: qty 30, 30d supply, fill #10

## 2023-01-18 ENCOUNTER — Encounter: Payer: PRIVATE HEALTH INSURANCE | Admitting: Physical Therapy

## 2023-01-23 ENCOUNTER — Encounter: Payer: PRIVATE HEALTH INSURANCE | Admitting: Physical Therapy

## 2023-01-30 ENCOUNTER — Ambulatory Visit: Payer: PRIVATE HEALTH INSURANCE | Admitting: Family Medicine

## 2023-01-30 ENCOUNTER — Encounter: Payer: Self-pay | Admitting: Family Medicine

## 2023-01-30 ENCOUNTER — Other Ambulatory Visit: Payer: Self-pay

## 2023-01-30 ENCOUNTER — Telehealth: Payer: Self-pay | Admitting: Family Medicine

## 2023-01-30 VITALS — BP 148/86 | HR 73 | Ht 66.0 in | Wt 256.0 lb

## 2023-01-30 DIAGNOSIS — E119 Type 2 diabetes mellitus without complications: Secondary | ICD-10-CM | POA: Diagnosis not present

## 2023-01-30 DIAGNOSIS — M25572 Pain in left ankle and joints of left foot: Secondary | ICD-10-CM

## 2023-01-30 DIAGNOSIS — M25512 Pain in left shoulder: Secondary | ICD-10-CM

## 2023-01-30 DIAGNOSIS — G8929 Other chronic pain: Secondary | ICD-10-CM | POA: Diagnosis not present

## 2023-01-30 NOTE — Telephone Encounter (Signed)
Patient states [facility] requires PA for Semaglutide, 2 MG/DOSE, (OZEMPIC, 2 MG/DOSE,) 8 MG/3ML SOPN .  Patient requests RX for Semaglutide, 2 MG/DOSE, (OZEMPIC, 2 MG/DOSE,) 8 MG/3ML SOPN  be sent to [facility] located on [address], [place].

## 2023-01-30 NOTE — Patient Instructions (Addendum)
Thank you for coming in today.   You received an injection today. Seek immediate medical attention if the joint becomes red, extremely painful, or is oozing fluid.   Check back as needed 

## 2023-01-30 NOTE — Progress Notes (Unsigned)
I, Stephanie Mccall, CMA acting as a scribe for Stephanie Leader, MD.  Stephanie Mccall is a 52 y.o. female who presents to [place] at [facility] today for L shoulder and back pain. Pt works as a Technical brewer at [employer]. Pt was last seen by Dr. Georgina Mccall on 11/28/22 for bilat shoulder pain and was Mccall bilat [place] steroid injections and was referred to PT, completing 3 visits. Today, pt reports continued left shoulder pain. Radicular sx present, sharp shooting. Sx have become constant. Pain when reaching up and reaching back.   Also c/o left ankle pain, managed by Dr. Sharol Mccall. Pt is traveling tomorrow and unable to get in to see Dr. Sharol Mccall before leaving.   She is planning on having surgery with Dr. Sharol Mccall for this left ankle in about 4 months.  She has had a steroid injection in the left ankle in the past which helped for a month or 2.  Dx imaging: 11/28/22 R & L shoulder XR  Pertinent review of systems: No fevers or chills  Relevant historical information: Hypertension and diabetes.   Exam:  BP (!) 148/86   Pulse 73   Ht 5\' 6"  (1.676 m)   Wt 256 lb (116.1 kg)   SpO2 98%   BMI 41.32 kg/m  General: Well Developed, well nourished, and in no acute distress.   MSK: Left shoulder: Normal-appearing Normal motion with crepitation..  Pain with abduction. Positive Hawkins and Neer's test. Strength reduced abduction.  Left ankle: Mature scar lateral ankle.  Large ankle effusion is present.  Tender palpation lateral ankle. Decreased range of motion.   Lab and Radiology Results  Procedure: Real-time Ultrasound Guided Injection of shoulder subacromial bursa Device: Philips Affiniti 50G Images permanently stored and available for review in PACS Verbal informed consent obtained.  Discussed risks and benefits of procedure. Warned about infection, bleeding, hyperglycemia damage to structures among others. Patient expresses understanding and agreement Time-out conducted.   Noted no  overlying erythema, induration, or other signs of local infection.   Skin prepped in a sterile fashion.   Local anesthesia: Topical Ethyl chloride.   With sterile technique and under real time ultrasound guidance: 40 mg of Kenalog and 2 mL Marcaine injected into subacromial bursa. Fluid seen entering the bursa.   Completed without difficulty   Pain immediately resolved suggesting accurate placement of the medication.   Advised to call if fevers/chills, erythema, induration, drainage, or persistent bleeding.   Images permanently stored and available for review in the ultrasound unit.  Impression: Technically successful ultrasound guided injection.    Procedure: Real-time Ultrasound Guided Injection of left ankle joint lateral approach Device: Philips Affiniti 50G Images permanently stored and available for review in PACS Verbal informed consent obtained.  Discussed risks and benefits of procedure. Warned about infection, bleeding, hyperglycemia damage to structures among others. Patient expresses understanding and agreement Time-out conducted.   Noted no overlying erythema, induration, or other signs of local infection.   Skin prepped in a sterile fashion.   Local anesthesia: Topical Ethyl chloride.   With sterile technique and under real time ultrasound guidance: 40 mg Kenalog 2 mL Marcaine injected into ankle joint. Fluid seen entering the joint capsule.   Completed without difficulty   Pain immediately resolved suggesting accurate placement of the medication.   Advised to call if fevers/chills, erythema, induration, drainage, or persistent bleeding.   Images permanently stored and available for review in the ultrasound unit.  Impression: Technically successful ultrasound guided injection.  EXAM: LEFT SHOULDER - 2+ VIEW   COMPARISON:  None Available.   FINDINGS: Mild-to-moderate inferior and posterior glenoid degenerative osteophytosis. Mild inferior humeral head-neck junction  degenerative osteophytosis. Minimal peripheral acromioclavicular degenerative osteophytosis. No acute fracture or dislocation. The visualized portion of the left lung is unremarkable.   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     Electronically Signed   By: Stephanie Mccall M.D.   On: 11/29/2022 10:48 I, Stephanie Mccall, personally (independently) visualized and performed the interpretation of the images attached in this note.      Assessment and Plan: 52 y.o. female with chronic left shoulder pain.  She had pretty good but about 2 months only of results from left shoulder glenohumeral joint injection in January.  Her pain is returned.  Will attempt a subacromial injection in hopes that that works a little better or longer.  She has done some physical therapy for this which helped a little.  She is going back to the [place] to visit her family for about 2 weeks so would like to help her have a little bit less pain.  Additionally she has chronic left ankle pain.  Fundamentally she needs a surgical revision of her left ankle.  Dr. Sharol Mccall has notes that outlined this well.  She has been delaying surgery over the last year.  We discussed options.  I reluctantly performed a steroid injection in the left ankle.  Surgery is her best option at this point.  We discussed that a steroid injection today could delay or interfere with Dr. Jess Mccall ability to perform surgery successfully.  She is aware of the risks of steroid injection as stated above and also including infection of hardware or potentially making things worse..  Issue we talked about injection risks for 2 injections today.  She has diabetes is pretty well-controlled.  She should expect some hyperglycemia but it should be well-controlled enough.  Diabetes is well-controlled   PDMP not reviewed this encounter. Orders Placed This Encounter  Procedures   [place] LIMITED JOINT SPACE STRUCTURES LOW LEFT(NO  LINKED CHARGES)    Order Specific Question:   Reason for Exam (SYMPTOM  OR DIAGNOSIS REQUIRED)    Answer:   lt ankle    Order Specific Question:   Preferred imaging location?    Answer:   [facility]   No orders of the defined types were placed in this encounter.    Discussed warning signs or symptoms. Please see discharge instructions. Patient expresses understanding.   The above documentation has been reviewed and is accurate and complete Stephanie Mccall, M.D.

## 2023-01-31 ENCOUNTER — Other Ambulatory Visit (HOSPITAL_COMMUNITY): Payer: Self-pay

## 2023-01-31 ENCOUNTER — Other Ambulatory Visit: Payer: Self-pay | Admitting: *Deleted

## 2023-01-31 MED ORDER — OZEMPIC (2 MG/DOSE) 8 MG/3ML ~~LOC~~ SOPN
2.0000 mg | PEN_INJECTOR | SUBCUTANEOUS | 3 refills | Status: DC
  Filled 2023-01-31: qty 3, 28d supply, fill #0
  Filled 2023-03-29: qty 3, 28d supply, fill #1
  Filled 2023-05-02: qty 3, 28d supply, fill #2
  Filled 2023-06-13: qty 3, 28d supply, fill #3
  Filled 2023-07-08 – 2023-07-13 (×2): qty 3, 28d supply, fill #4
  Filled 2023-07-31 – 2023-08-05 (×2): qty 3, 28d supply, fill #5
  Filled 2023-09-02: qty 3, 28d supply, fill #6
  Filled 2023-10-04: qty 3, 28d supply, fill #7
  Filled 2023-10-28: qty 3, 28d supply, fill #8
  Filled 2023-11-25: qty 3, 28d supply, fill #9
  Filled 2023-12-24: qty 3, 28d supply, fill #10

## 2023-01-31 NOTE — Telephone Encounter (Signed)
Rx sent to the pharmacy.

## 2023-02-14 ENCOUNTER — Other Ambulatory Visit (HOSPITAL_COMMUNITY): Payer: Self-pay

## 2023-02-15 ENCOUNTER — Encounter: Payer: Self-pay | Admitting: Orthopedic Surgery

## 2023-02-15 ENCOUNTER — Ambulatory Visit: Payer: PRIVATE HEALTH INSURANCE | Admitting: Orthopedic Surgery

## 2023-02-15 DIAGNOSIS — M7672 Peroneal tendinitis, left leg: Secondary | ICD-10-CM | POA: Diagnosis not present

## 2023-02-15 DIAGNOSIS — M25572 Pain in left ankle and joints of left foot: Secondary | ICD-10-CM

## 2023-02-15 NOTE — Progress Notes (Signed)
Office Visit Note   Patient: Stephanie Mccall           Date of Birth: [birth year]           MRN: [id number] Visit Date: 02/15/2023              Requested by: Tawnya Crook, MD [place],  [place] [address] PCP: Tawnya Crook, MD  Chief Complaint  Patient presents with   Left Ankle - Pain      HPI: Patient is a 52 year old woman who is seen in follow-up for left foot and ankle pain.  Patient is status post a calcaneal osteotomy x 2 with failed hardware laterally.  Patient has pain over the retained hardware complains of pain over the peroneal tendons and pain along the base of the fifth metatarsal.  Assessment & Plan: Visit Diagnoses:  1. Pain in left ankle and joints of left foot   2. Peroneal tendinitis of left lower extremity     Plan: Patient states she would like to proceed with surgical intervention.  Recommended surgery would be to remove the broken retained hardware lateral calcaneus plan for fusion of the calcaneocuboid joint and evaluate the peroneal tendons.  May need to debride the tendons versus reinforce the peroneus brevis with the peroneus longus.  Discussed that she would be nonweightbearing for 4 weeks out of work for 2 to 3 months.  Patient works as a Quarry manager.  She states she would like to proceed with surgery in September.  Follow-Up Instructions: Return if symptoms worsen or fail to improve.   Ortho Exam  Patient is alert, oriented, no adenopathy, well-dressed, normal affect, normal respiratory effort. Examination patient has a good palpable pulse she has tenderness to palpation along the peroneal tendons however there is no swelling.  She has pain over the retained hardware as well as pain over the calcaneocuboid joint.  She has 2 vertical incisions.  Radiograph shows failure of the hardware distal calcaneus.  Imaging: No results found. No images are attached to the encounter.  Labs: Lab Results  Component Value Date   HGBA1C 7.0 (H)  01/09/2023   HGBA1C 8.4 (H) 06/28/2022   HGBA1C 7.8 (H) 03/29/2022     Lab Results  Component Value Date   ALBUMIN 4.5 01/09/2023   ALBUMIN 4.1 06/28/2022   ALBUMIN 4.4 03/29/2022    No results found for: "MG" Lab Results  Component Value Date   VD25OH 31.72 10/14/2021    No results found for: "PREALBUMIN"    Latest Ref Rng & Units 01/09/2023   10:14 AM 04/05/2022    9:35 AM 03/29/2022    9:09 AM  CBC EXTENDED  WBC 4.0 - 10.5 K/uL 3.8  4.6  4.5   RBC 3.87 - 5.11 Mil/uL 5.32  5.18  4.63   Hemoglobin 12.0 - 15.0 g/dL 14.9  14.0  12.9   HCT 36.0 - 46.0 % 45.5  45.2  39.3   Platelets 150.0 - 400.0 K/uL 284.0  259  256.0   NEUT# 1.4 - 7.7 K/uL   2.5   Lymph# 0.7 - 4.0 K/uL   1.6      There is no height or weight on file to calculate BMI.  Orders:  No orders of the defined types were placed in this encounter.  No orders of the defined types were placed in this encounter.    Procedures: No procedures performed  Clinical Data: No additional findings.  ROS:  All other systems  negative, except as noted in the HPI. Review of Systems  Objective: Vital Signs: There were no vitals taken for this visit.  Specialty Comments:  No specialty comments available.  PMFS History: Patient Active Problem List   Diagnosis Date Noted   Controlled type 2 diabetes mellitus without complication, without long-term current use of insulin 09/29/2021   Hypertension associated with diabetes 09/29/2021   Hyperlipidemia associated with type 2 diabetes mellitus 09/29/2021   Morbid obesity 09/29/2021   Past Medical History:  Diagnosis Date   Allergy    SEASONAL   Arthritis    SHOULDERS   Diabetes mellitus without complication    Hyperlipidemia    Hypertension    Sleep apnea     Family History  Problem Relation Age of Onset   Lung cancer Maternal Aunt    Lung cancer Maternal Uncle    Breast cancer Maternal Grandmother    Colon cancer Neg Hx    Colon polyps Neg Hx     Crohn's disease Neg Hx    Esophageal cancer Neg Hx    Rectal cancer Neg Hx    Stomach cancer Neg Hx    Ulcerative colitis Neg Hx     Past Surgical History:  Procedure Laterality Date   ABDOMINAL HYSTERECTOMY     partial. fibroids   ANKLE SURGERY Left    screws to straighten   CHOLECYSTECTOMY     KNEE ARTHROSCOPY Right    Social History   Occupational History   Not on file  Tobacco Use   Smoking status: Never    Passive exposure: Past   Smokeless tobacco: Never  Vaping Use   Vaping Use: Never used  Substance and Sexual Activity   Alcohol use: Yes    Comment: occasoinally   Drug use: Never   Sexual activity: Yes

## 2023-02-16 ENCOUNTER — Other Ambulatory Visit (HOSPITAL_COMMUNITY): Payer: Self-pay

## 2023-02-19 ENCOUNTER — Other Ambulatory Visit (HOSPITAL_COMMUNITY): Payer: Self-pay

## 2023-02-20 ENCOUNTER — Other Ambulatory Visit (HOSPITAL_COMMUNITY): Payer: Self-pay

## 2023-02-20 NOTE — Telephone Encounter (Signed)
Patient states that [facility] pharmacy informed her that a prior authorization is needed for Ozempic. Patient requests for process to be started.

## 2023-02-21 ENCOUNTER — Other Ambulatory Visit (HOSPITAL_COMMUNITY): Payer: Self-pay

## 2023-02-22 ENCOUNTER — Other Ambulatory Visit (HOSPITAL_COMMUNITY): Payer: Self-pay

## 2023-02-22 ENCOUNTER — Telehealth: Payer: Self-pay

## 2023-02-22 NOTE — Telephone Encounter (Signed)
Patient Advocate Encounter   Received notification from [employer] that prior authorization is required for Ozempic (2 MG/DOSE) 8MG /3ML pen-injectors   Submitted: 02-22-2023 Key  [id number]  Status is pending

## 2023-02-23 ENCOUNTER — Other Ambulatory Visit (HOSPITAL_COMMUNITY): Payer: Self-pay

## 2023-02-23 NOTE — Telephone Encounter (Signed)
Patient notified and verbalized understanding. 

## 2023-02-23 NOTE — Telephone Encounter (Signed)
Patient Advocate Encounter  Prior Authorization for Ozempic (2 MG/DOSE) 8MG /3ML pen-injectors has been approved through [employer].    Key: [id number]  Effective: 02-22-2023 to 02-22-2024

## 2023-02-26 ENCOUNTER — Other Ambulatory Visit (HOSPITAL_COMMUNITY): Payer: Self-pay

## 2023-03-29 ENCOUNTER — Other Ambulatory Visit: Payer: Self-pay

## 2023-04-26 ENCOUNTER — Other Ambulatory Visit (HOSPITAL_COMMUNITY): Payer: Self-pay

## 2023-05-02 ENCOUNTER — Other Ambulatory Visit (HOSPITAL_COMMUNITY): Payer: Self-pay

## 2023-05-03 ENCOUNTER — Other Ambulatory Visit (HOSPITAL_COMMUNITY): Payer: Self-pay

## 2023-05-10 ENCOUNTER — Other Ambulatory Visit (HOSPITAL_COMMUNITY): Payer: Self-pay

## 2023-05-16 ENCOUNTER — Other Ambulatory Visit: Payer: Self-pay

## 2023-05-16 ENCOUNTER — Other Ambulatory Visit (HOSPITAL_COMMUNITY): Payer: Self-pay

## 2023-06-01 ENCOUNTER — Other Ambulatory Visit (HOSPITAL_COMMUNITY): Payer: Self-pay

## 2023-06-04 ENCOUNTER — Other Ambulatory Visit: Payer: Self-pay | Admitting: *Deleted

## 2023-06-04 ENCOUNTER — Telehealth: Payer: Self-pay | Admitting: Family Medicine

## 2023-06-04 MED ORDER — TRIAMCINOLONE ACETONIDE 0.1 % EX CREA: 1.0000 | TOPICAL_CREAM | Freq: Two times a day (BID) | CUTANEOUS | 0 refills | Status: DC

## 2023-06-04 MED ORDER — KETOCONAZOLE 2 % EX SHAM: 1.0000 | MEDICATED_SHAMPOO | CUTANEOUS | 3 refills | Status: DC

## 2023-06-04 MED ORDER — KETOCONAZOLE 2 % EX CREA: 1.0000 | TOPICAL_CREAM | Freq: Every day | CUTANEOUS | 1 refills | Status: DC

## 2023-06-04 NOTE — Telephone Encounter (Signed)
Please advise, if okay to refill?

## 2023-06-04 NOTE — Telephone Encounter (Signed)
Rx's sent to the pharmacy. 

## 2023-06-04 NOTE — Telephone Encounter (Signed)
Prescription Request  06/04/2023  LOV: 01/02/2023  What is the name of the medication or equipment? triamcinolone cream (KENALOG) 0.1 %  AND  ketoconazole (NIZORAL) 2 % cream AND ketoconazole (NIZORAL) 2 % shampoo  Have you contacted your pharmacy to request a refill? Yes   Which pharmacy would you like this sent to?   [facility] [address] [place], [place] - [address] [place] [address] Phone: [phone] Fax: [phone]    Patient notified that their request is being sent to the clinical staff for review and that they should receive a response within 2 business days.   Please advise at Mobile [phone] (mobile)

## 2023-06-12 ENCOUNTER — Ambulatory Visit: Payer: PRIVATE HEALTH INSURANCE | Admitting: Family

## 2023-06-12 ENCOUNTER — Encounter: Payer: Self-pay | Admitting: Family

## 2023-06-12 VITALS — BP 142/78 | HR 89 | Temp 97.6°F | Resp 16 | Ht 69.0 in | Wt 261.0 lb

## 2023-06-12 DIAGNOSIS — Z789 Other specified health status: Secondary | ICD-10-CM

## 2023-06-12 NOTE — Progress Notes (Signed)
  Subjective:     Patient ID: Stephanie Mccall, female   DOB: [birth year], 52 y.o.   MRN: [id number]  HPI   Review of Systems  Constitutional:  Positive for fatigue and unexpected weight change.  Eyes:  Positive for pain and itching.  Musculoskeletal:  Positive for arthralgias.       Objective:   Physical Exam Constitutional:      Appearance: Normal appearance. She is obese.  HENT:     Head: Normocephalic.     Right Ear: Tympanic membrane normal.     Left Ear: Tympanic membrane normal.  Eyes:     Pupils: Pupils are equal, round, and reactive to light.  Cardiovascular:     Rate and Rhythm: Normal rate and regular rhythm.     Pulses: Normal pulses.  Pulmonary:     Effort: Pulmonary effort is normal.     Breath sounds: Normal breath sounds.  Musculoskeletal:        General: Normal range of motion.     Cervical back: Normal range of motion.  Skin:    General: Skin is warm and dry.  Neurological:     General: No focal deficit present.     Mental Status: She is alert.  Psychiatric:        Mood and Affect: Mood normal.        Behavior: Behavior normal.        Assessment:     Here for wellness exam for health insurance. Stephanie Mccall follows a PCP closely for HTN and DM2. She sees her PCP every 3 months.      Plan:     Follow a diabetic diet, with concentration on fruits and vegetables. Keeping excess salt and fats to a minimum.   She should try to exercise 3 days a week for 30 minutes.   Maintain regular sleep schedule.  Drink 8 glasses of water daily.  Keep appointments with PCP.   RTC as needed.

## 2023-06-13 ENCOUNTER — Encounter (INDEPENDENT_AMBULATORY_CARE_PROVIDER_SITE_OTHER): Payer: Self-pay

## 2023-06-19 ENCOUNTER — Encounter: Payer: Self-pay | Admitting: Family Medicine

## 2023-06-19 ENCOUNTER — Telehealth: Payer: PRIVATE HEALTH INSURANCE | Admitting: Family Medicine

## 2023-06-19 ENCOUNTER — Other Ambulatory Visit (HOSPITAL_COMMUNITY): Payer: Self-pay

## 2023-06-19 DIAGNOSIS — U071 COVID-19: Secondary | ICD-10-CM

## 2023-06-19 MED ORDER — NIRMATRELVIR/RITONAVIR (PAXLOVID)TABLET
3.0000 | ORAL_TABLET | Freq: Two times a day (BID) | ORAL | 0 refills | Status: AC
Start: 2023-06-19 — End: 2023-06-24
  Filled 2023-06-19: qty 30, 5d supply, fill #0

## 2023-06-19 NOTE — Progress Notes (Signed)
MyChart Video Visit Virtual Visit via Video Note   This visit type was conducted w/patient consent. This format is felt to be most appropriate for this patient at this time. Physical exam was limited by quality of the video and audio technology used for the visit. CMA was able to get the patient set up on a video visit.  Patient location: Home. Patient and provider in visit Provider location: Office  I discussed the limitations of evaluation and management by telemedicine and the availability of in person appointments. The patient expressed understanding and agreed to proceed.  Visit Date: 06/19/2023  Today's healthcare provider: Angelena Sole, MD     Subjective:    Patient ID: Stephanie Mccall, female    DOB: 1971-10-28, 52 y.o.   MRN: [id number]  Chief Complaint  Patient presents with   Covid Positive    Tested positive for covid on yesterday Sx started Sunday afternoon   Headache   Generalized Body Aches   Diarrhea   Nasal Congestion   Sore Throat    HPI Above. 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 Has taken paxlovid in past.   Past Medical History:  Diagnosis Date   Allergy    SEASONAL   Arthritis    SHOULDERS   Diabetes mellitus without complication (HCC)    Hyperlipidemia    Hypertension    Sleep apnea     Past Surgical History:  Procedure Laterality Date   ABDOMINAL HYSTERECTOMY     partial. fibroids   ANKLE SURGERY Left    screws to straighten   CHOLECYSTECTOMY     KNEE ARTHROSCOPY Right     Outpatient Medications Prior to Visit  Medication Sig Dispense Refill   Accu-Chek Softclix Lancets lancets Test up to twice daily as directed in the morning fasting and when hypoglycemic 100 each 1   Acetaminophen (TYLENOL PO) Take by mouth as needed.     amLODipine (NORVASC) 10 MG tablet Take 1 tablet (10 mg total) by mouth daily. 90 tablet 3   aspirin EC 81 MG tablet Take 1 tablet (81 mg total) by mouth daily. 30 tablet 3    Aspirin-Acetaminophen-Caffeine (EXCEDRIN PO) Take by mouth as needed.     Blood Glucose Monitoring Suppl (BLOOD GLUCOSE MONITOR SYSTEM) w/Device KIT Test up to twice daily as directed in the morning fasting and when hypoglycemic 1 kit 0   glipiZIDE (GLUCOTROL XL) 2.5 MG 24 hr tablet Take 1 tablet (2.5 mg total) by mouth daily. 90 tablet 1   glucose blood test strip Test up to twice daily as directed in the morning fasting and when hypoglycemic 100 each 1   hydrOXYzine (ATARAX) 10 MG tablet Take 1-3 tablets (10-30 mg total) by mouth every 4 - 6  hours as needed. 30 tablet 0   ibuprofen (ADVIL) 800 MG tablet Take 1 tablet (800 mg total) by mouth every 8 (eight) hours as needed for mild to moderate pain for up to 10 days 30 tablet 3   ketoconazole (NIZORAL) 2 % cream Apply 1 Application topically daily. Face/scalp rash 15 g 1   ketoconazole (NIZORAL) 2 % shampoo Apply 1 Application topically 2 (two) times a week. 120 mL 3   lidocaine (LIDODERM) 5 % Place 1 patch onto the skin daily. Remove & Discard patch within 12 hours or as directed. 30 patch 3   losartan-hydrochlorothiazide (HYZAAR) 100-25 MG tablet Take 1 tablet by mouth daily. 90 tablet 3   metFORMIN (GLUCOPHAGE) 1000 MG tablet Take 1  tablet (1,000 mg total) by mouth 2 (two) times daily. 180 tablet 1   pravastatin (PRAVACHOL) 20 MG tablet Take 1 tablet (20 mg total) by mouth daily. 90 tablet 0   pravastatin (PRAVACHOL) 80 MG tablet Take 1 tablet (80 mg total) by mouth daily. 90 tablet 3   Semaglutide, 2 MG/DOSE, (OZEMPIC, 2 MG/DOSE,) 8 MG/3ML SOPN Inject 2 mg into the skin once a week. 9 mL 3   triamcinolone cream (KENALOG) 0.1 % Apply 1 Application topically 2 (two) times daily. For rash 30 g 0   No facility-administered medications prior to visit.    Allergies  Allergen Reactions   Nitroglycerin Itching        Objective:     Physical Exam  Vitals and nursing note reviewed.  Constitutional:      General:  is not in acute  distress.    Appearance: Normal appearance.  HENT:     Head: Normocephalic.  Pulmonary:     Effort: No respiratory distress.  Skin:    General: Skin is dry.     Coloration: Skin is not pale.  Neurological:     Mental Status: Pt is alert and oriented to person, place, and time.  Psychiatric:        Mood and Affect: Mood normal.   There were no vitals taken for this visit.  Wt Readings from Last 3 Encounters:  06/12/23 261 lb (118.4 kg)  01/30/23 256 lb (116.1 kg)  01/02/23 254 lb (115.2 kg)       Assessment & Plan:   Problem List Items Addressed This Visit   None Visit Diagnoses     COVID-19    -  Primary   Relevant Medications   nirmatrelvir/ritonavir (PAXLOVID) 20 x 150 MG & 10 x 100MG  TABS      Covid-pt high risk.  Will do paxlovid.  Hold pravastatin.  Off work 5 days but pt to clarify w/employer  Meds ordered this encounter  Medications   nirmatrelvir/ritonavir (PAXLOVID) 20 x 150 MG & 10 x 100MG  TABS    Sig: Take 3 tablets by mouth 2 (two) times daily for 5 days. (Take nirmatrelvir 150 mg two tablets twice daily for 5 days and ritonavir 100 mg one tablet twice daily for 5 days) Patient GFR is 99    Dispense:  30 tablet    Refill:  0    I discussed the assessment and treatment plan with the patient. The patient was provided an opportunity to ask questions and all were answered. The patient agreed with the plan and demonstrated an understanding of the instructions.   The patient was advised to call back or seek an in-person evaluation if the symptoms worsen or if the condition fails to improve as anticipated.  No follow-ups on file.  Angelena Sole, MD [facility] [phone] (phone) [phone] (fax)  [facility]

## 2023-06-19 NOTE — Patient Instructions (Signed)
Check with work on policy of time off  Paxlovid sent.   Hold pravastatin while on it.

## 2023-06-20 ENCOUNTER — Other Ambulatory Visit (HOSPITAL_COMMUNITY): Payer: Self-pay

## 2023-06-25 ENCOUNTER — Ambulatory Visit: Payer: PRIVATE HEALTH INSURANCE | Admitting: Orthopedic Surgery

## 2023-06-30 ENCOUNTER — Other Ambulatory Visit: Payer: Self-pay | Admitting: Family Medicine

## 2023-07-03 ENCOUNTER — Encounter: Payer: Self-pay | Admitting: Family Medicine

## 2023-07-03 ENCOUNTER — Ambulatory Visit: Payer: PRIVATE HEALTH INSURANCE | Admitting: Family Medicine

## 2023-07-03 VITALS — BP 132/78 | HR 87 | Temp 98.0°F | Resp 18 | Ht 66.0 in | Wt 258.5 lb

## 2023-07-03 DIAGNOSIS — Z7984 Long term (current) use of oral hypoglycemic drugs: Secondary | ICD-10-CM | POA: Diagnosis not present

## 2023-07-03 DIAGNOSIS — E1159 Type 2 diabetes mellitus with other circulatory complications: Secondary | ICD-10-CM | POA: Diagnosis not present

## 2023-07-03 DIAGNOSIS — E119 Type 2 diabetes mellitus without complications: Secondary | ICD-10-CM | POA: Diagnosis not present

## 2023-07-03 DIAGNOSIS — Z7985 Long-term (current) use of injectable non-insulin antidiabetic drugs: Secondary | ICD-10-CM

## 2023-07-03 DIAGNOSIS — E785 Hyperlipidemia, unspecified: Secondary | ICD-10-CM | POA: Diagnosis not present

## 2023-07-03 DIAGNOSIS — Z23 Encounter for immunization: Secondary | ICD-10-CM

## 2023-07-03 DIAGNOSIS — I152 Hypertension secondary to endocrine disorders: Secondary | ICD-10-CM | POA: Diagnosis not present

## 2023-07-03 DIAGNOSIS — E1169 Type 2 diabetes mellitus with other specified complication: Secondary | ICD-10-CM

## 2023-07-03 MED ORDER — METFORMIN HCL 1000 MG PO TABS
1000.0000 mg | ORAL_TABLET | Freq: Two times a day (BID) | ORAL | 0 refills | Status: DC
Start: 2023-07-03 — End: 2023-12-10

## 2023-07-03 MED ORDER — GLIPIZIDE ER 2.5 MG PO TB24
2.5000 mg | ORAL_TABLET | Freq: Every day | ORAL | 1 refills | Status: DC
Start: 2023-07-03 — End: 2024-01-04

## 2023-07-03 NOTE — Assessment & Plan Note (Signed)
chronic. Controlled. Cont hyzaar 100/25/amlodipine 10mg 

## 2023-07-03 NOTE — Assessment & Plan Note (Signed)
Chronic.  Controlled.  Continue glipizide ER 2.5 mg daily, metformin at 1000 mg twice daily, Ozempic 2 mg subq weekly.  Pneumococcal vaccination was given.  Foot and eye exam are up-to-date

## 2023-07-03 NOTE — Progress Notes (Signed)
Subjective:     Patient ID: Stephanie Mccall, female    DOB: 11-Nov-1971, 52 y.o.   MRN: [id number]  Chief Complaint  Patient presents with   Medical Management of Chronic Issues    6 month follow-up on htn, dm     HPI HTN - Pt is on Amlodipine 10 mg and Losartan 100-25 mg daily.  Bp's running 130s/78. BP at initial check was 140/88. Bp at recheck was 132/78. No ha/dizziness/cp/palp/edema/cough/sob  DM - States she used to check her sugars every day, and has now switched to weekly. Is usually running in the 120s-130s. Is compliant with Metformin 1000 mg BID, Ozempic 2 mg, and Glipizide 2.5 mg daily. Denies exercising. Attempts to eat healthy.   HLD -  Pt is compliant with Pravastatin 80 mg daily.   Left Ankle Pain - Endorses left ankle pain. Is scheduled for surgery in the next month. Is currently wearing an ankle brace.    Health Maintenance Due  Topic Date Due   DTaP/Tdap/Td (1 - Tdap) Never done   Diabetic kidney evaluation - Urine ACR  03/30/2023    Past Medical History:  Diagnosis Date   Allergy    SEASONAL   Arthritis    SHOULDERS   Diabetes mellitus without complication (HCC)    Hyperlipidemia    Hypertension    Sleep apnea     Past Surgical History:  Procedure Laterality Date   ABDOMINAL HYSTERECTOMY     partial. fibroids   ANKLE SURGERY Left    screws to straighten   CHOLECYSTECTOMY     KNEE ARTHROSCOPY Right      Current Outpatient Medications:    Accu-Chek Softclix Lancets lancets, Test up to twice daily as directed in the morning fasting and when hypoglycemic, Disp: 100 each, Rfl: 1   Acetaminophen (TYLENOL PO), Take by mouth as needed., Disp: , Rfl:    amLODipine (NORVASC) 10 MG tablet, Take 1 tablet (10 mg total) by mouth daily., Disp: 90 tablet, Rfl: 3   aspirin EC 81 MG tablet, Take 1 tablet (81 mg total) by mouth daily., Disp: 30 tablet, Rfl: 3   Aspirin-Acetaminophen-Caffeine (EXCEDRIN PO), Take by mouth as needed., Disp: , Rfl:    Blood Glucose  Monitoring Suppl (BLOOD GLUCOSE MONITOR SYSTEM) w/Device KIT, Test up to twice daily as directed in the morning fasting and when hypoglycemic, Disp: 1 kit, Rfl: 0   glucose blood test strip, Test up to twice daily as directed in the morning fasting and when hypoglycemic, Disp: 100 each, Rfl: 1   hydrOXYzine (ATARAX) 10 MG tablet, Take 1-3 tablets (10-30 mg total) by mouth every 4 - 6  hours as needed., Disp: 30 tablet, Rfl: 0   ibuprofen (ADVIL) 800 MG tablet, Take 1 tablet (800 mg total) by mouth every 8 (eight) hours as needed for mild to moderate pain for up to 10 days, Disp: 30 tablet, Rfl: 3   ketoconazole (NIZORAL) 2 % cream, Apply 1 Application topically daily. Face/scalp rash, Disp: 15 g, Rfl: 1   ketoconazole (NIZORAL) 2 % shampoo, Apply 1 Application topically 2 (two) times a week., Disp: 120 mL, Rfl: 3   lidocaine (LIDODERM) 5 %, Place 1 patch onto the skin daily. Remove & Discard patch within 12 hours or as directed., Disp: 30 patch, Rfl: 3   losartan-hydrochlorothiazide (HYZAAR) 100-25 MG tablet, Take 1 tablet by mouth daily., Disp: 90 tablet, Rfl: 3   pravastatin (PRAVACHOL) 80 MG tablet, Take 1 tablet (80 mg total) by  mouth daily., Disp: 90 tablet, Rfl: 3   Semaglutide, 2 MG/DOSE, (OZEMPIC, 2 MG/DOSE,) 8 MG/3ML SOPN, Inject 2 mg into the skin once a week., Disp: 9 mL, Rfl: 3   triamcinolone cream (KENALOG) 0.1 %, Apply 1 Application topically 2 (two) times daily. For rash, Disp: 30 g, Rfl: 0   glipiZIDE (GLUCOTROL XL) 2.5 MG 24 hr tablet, Take 1 tablet (2.5 mg total) by mouth daily., Disp: 90 tablet, Rfl: 1   metFORMIN (GLUCOPHAGE) 1000 MG tablet, Take 1 tablet (1,000 mg total) by mouth 2 (two) times daily., Disp: 180 tablet, Rfl: 0  Allergies  Allergen Reactions   Nitroglycerin Itching   ROS neg/noncontributory except as noted HPI/below      Objective:     BP 132/78 (BP Location: Left Arm, Patient Position: Sitting, Cuff Size: Large)   Pulse 87   Temp 98 F (36.7 C)  (Temporal)   Resp 18   Ht 5\' 6"  (1.676 m)   Wt 258 lb 8 oz (117.3 kg)   SpO2 99%   BMI 41.72 kg/m  Wt Readings from Last 3 Encounters:  07/03/23 258 lb 8 oz (117.3 kg)  06/12/23 261 lb (118.4 kg)  01/30/23 256 lb (116.1 kg)    Physical Exam   Gen: WDWN NAD HEENT: NCAT, conjunctiva not injected, sclera nonicteric NECK:  supple, no thyromegaly, no nodes, no carotid bruits CARDIAC: RRR, S1S2+, no murmur. DP 2+B LUNGS: CTAB. No wheezes ABDOMEN:  BS+, soft, NTND, No HSM, no masses EXT:  no edema +Brace on left ankle MSK: no gross abnormalities.  NEURO: A&O x3.  CN II-XII intact.  PSYCH: normal mood. Good eye contact    Assessment & Plan:  Controlled type 2 diabetes mellitus without complication, without long-term current use of insulin (HCC) Assessment & Plan: Chronic.  Controlled.  Continue glipizide ER 2.5 mg daily, metformin at 1000 mg twice daily, Ozempic 2 mg subq weekly.  Pneumococcal vaccination was given.  Foot and eye exam are up-to-date  Orders: -     Comprehensive metabolic panel -     Hemoglobin A1c -     Microalbumin / creatinine urine ratio -     Vitamin B12 -     TSH -     glipiZIDE ER; Take 1 tablet (2.5 mg total) by mouth daily.  Dispense: 90 tablet; Refill: 1 -     metFORMIN HCl; Take 1 tablet (1,000 mg total) by mouth 2 (two) times daily.  Dispense: 180 tablet; Refill: 0  Hypertension associated with diabetes (HCC) Assessment & Plan: chronic. Controlled. Cont hyzaar 100/25/amlodipine 10mg    Orders: -     Comprehensive metabolic panel -     TSH -     CBC with Differential/Platelet  Hyperlipidemia associated with type 2 diabetes mellitus (HCC) Assessment & Plan: Chronic.  Not well controlled.  Pravastatin was increased to 80mg -check labs  Orders: -     Comprehensive metabolic panel -     Lipid panel -     TSH  Need for pneumococcal vaccination -     Pneumococcal conjugate vaccine 20-valent  Long term current use of oral hypoglycemic  drug  Long-term current use of injectable noninsulin antidiabetic medication    Return in about 6 months (around 01/03/2024) for HTN, cholesterol, DM.   Melina Fiddler N Rice,acting as a scribe for Angelena Sole, MD.,have documented all relevant documentation on the behalf of Angelena Sole, MD,as directed by  Angelena Sole, MD while in the presence of Dewayne Hatch  Rockney Ghee, MD.  I, Angelena Sole, MD, have reviewed all documentation for this visit. The documentation on 07/03/23 for the exam, diagnosis, procedures, and orders are all accurate and complete.    Angelena Sole, MD

## 2023-07-03 NOTE — Patient Instructions (Signed)

## 2023-07-03 NOTE — Assessment & Plan Note (Signed)
Chronic.  Not well controlled.  Pravastatin was increased to 80mg -check labs

## 2023-07-04 LAB — COMPREHENSIVE METABOLIC PANEL
ALT: 16 U/L (ref 0–35)
AST: 16 U/L (ref 0–37)
Albumin: 4.6 g/dL (ref 3.5–5.2)
Alkaline Phosphatase: 70 U/L (ref 39–117)
BUN: 23 mg/dL (ref 6–23)
CO2: 27 meq/L (ref 19–32)
Calcium: 10.5 mg/dL (ref 8.4–10.5)
Chloride: 103 meq/L (ref 96–112)
Creatinine, Ser: 0.77 mg/dL (ref 0.40–1.20)
GFR: 88.15 mL/min (ref >60.00)
Glucose, Bld: 93 mg/dL (ref 70–99)
Potassium: 3.5 meq/L (ref 3.5–5.1)
Sodium: 141 meq/L (ref 135–145)
Total Bilirubin: 0.3 mg/dL (ref 0.2–1.2)
Total Protein: 8.4 g/dL — ABNORMAL HIGH (ref 6.0–8.3)

## 2023-07-04 LAB — MICROALBUMIN / CREATININE URINE RATIO
Creatinine,U: 84.9 mg/dL
Microalb Creat Ratio: 0.8 mg/g (ref 0.0–30.0)
Microalb, Ur: <0.7 mg/dL (ref 0.0–1.9)

## 2023-07-04 LAB — CBC WITH DIFFERENTIAL/PLATELET
Basophils Absolute: 0.1 10*3/uL (ref 0.0–0.1)
Basophils Relative: 1.1 % (ref 0.0–3.0)
Eosinophils Absolute: 0.1 10*3/uL (ref 0.0–0.7)
Eosinophils Relative: 1.4 % (ref 0.0–5.0)
HCT: 41.7 % (ref 36.0–46.0)
Hemoglobin: 13.2 g/dL (ref 12.0–15.0)
Lymphocytes Relative: 35 % (ref 12.0–46.0)
Lymphs Abs: 2.2 10*3/uL (ref 0.7–4.0)
MCHC: 31.7 g/dL (ref 30.0–36.0)
MCV: 85.7 fl (ref 78.0–100.0)
Monocytes Absolute: 0.3 10*3/uL (ref 0.1–1.0)
Monocytes Relative: 5.1 % (ref 3.0–12.0)
Neutro Abs: 3.7 10*3/uL (ref 1.4–7.7)
Neutrophils Relative %: 57.4 % (ref 43.0–77.0)
Platelets: 311 10*3/uL (ref 150.0–400.0)
RBC: 4.87 Mil/uL (ref 3.87–5.11)
RDW: 14 % (ref 11.5–15.5)
WBC: 6.4 10*3/uL (ref 4.0–10.5)

## 2023-07-04 LAB — LIPID PANEL
Cholesterol: 216 mg/dL — ABNORMAL HIGH (ref 0–200)
HDL: 51.1 mg/dL (ref >39.00)
LDL Cholesterol: 143 mg/dL — ABNORMAL HIGH (ref 0–99)
NonHDL: 165.39
Total CHOL/HDL Ratio: 4
Triglycerides: 110 mg/dL (ref 0.0–149.0)
VLDL: 22 mg/dL (ref 0.0–40.0)

## 2023-07-04 LAB — HEMOGLOBIN A1C: Hgb A1c MFr Bld: 7.4 % — ABNORMAL HIGH (ref 4.6–6.5)

## 2023-07-06 LAB — VITAMIN B12: Vitamin B-12: 1001 pg/mL — ABNORMAL HIGH (ref 211–911)

## 2023-07-06 LAB — TSH: TSH: 1.14 u[IU]/mL (ref 0.35–5.50)

## 2023-07-08 NOTE — Progress Notes (Signed)
1.  Potassium is barely normal-send in kcl daily 2.  A1C is too high-will she do better on diet/exercise or do we need to discuss meds?(Sch appt).  If will do better, sch appt in 3 mo for progress 3.  Cholesterol too high-she is on max dose of pravastatin.  Has she ever been on any others?

## 2023-07-10 ENCOUNTER — Encounter: Payer: Self-pay | Admitting: Physician Assistant

## 2023-07-10 ENCOUNTER — Ambulatory Visit: Payer: PRIVATE HEALTH INSURANCE | Admitting: Physician Assistant

## 2023-07-10 ENCOUNTER — Other Ambulatory Visit (INDEPENDENT_AMBULATORY_CARE_PROVIDER_SITE_OTHER): Payer: PRIVATE HEALTH INSURANCE

## 2023-07-10 ENCOUNTER — Other Ambulatory Visit (HOSPITAL_COMMUNITY): Payer: Self-pay

## 2023-07-10 ENCOUNTER — Other Ambulatory Visit: Payer: Self-pay | Admitting: *Deleted

## 2023-07-10 DIAGNOSIS — M25561 Pain in right knee: Secondary | ICD-10-CM | POA: Diagnosis not present

## 2023-07-10 MED ORDER — MELOXICAM 15 MG PO TABS: 15.0000 mg | ORAL_TABLET | Freq: Every day | ORAL | 0 refills | Status: DC

## 2023-07-10 MED ORDER — POTASSIUM CHLORIDE CRYS ER 10 MEQ PO TBCR
10.0000 meq | EXTENDED_RELEASE_TABLET | Freq: Two times a day (BID) | ORAL | 1 refills | Status: DC
  Filled 2023-07-10: qty 60, 30d supply, fill #0
  Filled 2023-08-07: qty 60, 30d supply, fill #1
  Filled 2023-09-06: qty 60, 30d supply, fill #2

## 2023-07-10 NOTE — Progress Notes (Signed)
Office Visit Note   Patient: Stephanie Mccall           Date of Birth: February 13, 1971           MRN: [id number] Visit Date: 07/10/2023              Requested by: Jeani Sow, MD [address] 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  She said she did very well with her surgery had no complications with her recovery and has not had any problems.  She works as a Lawyer.  She says she began noticing more fluid in her knee a few days ago.  Denies any particular injury but she is on her feet quite a bit.  She denies any fever chills or malaise.  She denies any erythema since this happened.  She has had COVID 2 weeks ago but recovered from this uneventfully.  Rates her pain as mild to moderate secondary to the tightness in her knee from the effusion.  She is a diabetic with a hemoglobin A1c of 7.4  Assessment & Plan: Visit Diagnoses:  1. Acute pain of right knee     Plan: Her knee exam is benign with the exception of the effusion.  She is well-healed surgical incision no erythema no cellulitis compartments are soft and compressible and she has a negative Homans' sign.  She is neurovascular intact.  She has not had any fever chills or malaise.  I reviewed her case with Dr. Roda Shutters.  We discussed aspiration but he feels this would best first be treated since she has no constitutional symptoms with anti-inflammatories as he believes it could just be reactive synovitis.  Will begin her on meloxicam 1 daily.  She is to undergo hardware removal with Dr. Lajoyce Corners in a couple weeks on her left ankle.  The anti-inflammatory should not affect this.  I given her very strict precautions that if she does develop any fever or chills or redness in the knee or the pain significantly increases she is to contact us  immediately  Follow-Up Instructions: If no improvement  Ortho Exam  Patient is alert, oriented, no adenopathy, well-dressed, normal affect, normal respiratory effort. Right knee status post right total knee arthroplasty well-healed surgical incision.  She is neurovascular intact distally her compartments are soft nontender no swelling negative Homans' sign.  She does have a moderate effusion but no erythema no cellulitis mild warmth secondary to the effusion  Imaging: No results found. No images are attached to the encounter.  Labs: Lab Results  Component Value Date   HGBA1C 7.4 (H) 07/03/2023   HGBA1C 7.0 (H) 01/09/2023   HGBA1C 8.4 (H) 06/28/2022     Lab Results  Component Value Date   ALBUMIN 4.6 07/03/2023   ALBUMIN 4.5 01/09/2023   ALBUMIN 4.1 06/28/2022    No results found for: "MG" Lab Results  Component Value Date   VD25OH 31.72 10/14/2021    No results found for: "PREALBUMIN"    Latest Ref Rng & Units 07/03/2023    3:53 PM 01/09/2023   10:14 AM 04/05/2022    9:35 AM  CBC EXTENDED  WBC 4.0 - 10.5 K/uL 6.4  3.8  4.6   RBC 3.87 - 5.11 Mil/uL 4.87  5.32  5.18   Hemoglobin 12.0 - 15.0 g/dL 16.1  09.6  04.5   HCT 36.0 - 46.0 % 41.7  45.5  45.2   Platelets 150.0 - 400.0 K/uL 311.0  284.0  259   NEUT# 1.4 - 7.7 K/uL 3.7     Lymph# 0.7 - 4.0 K/uL 2.2        There is no height or weight on file to calculate BMI.  Orders:  Orders Placed This Encounter  Procedures   XR Knee 1-2 Views Right   Meds ordered this encounter  Medications   meloxicam (MOBIC) 15 MG tablet    Sig: Take 1 tablet (15 mg total) by mouth daily.    Dispense:  30 tablet    Refill:  0     Procedures: No procedures performed  Clinical Data: No additional findings.  ROS:  All other systems negative, except as noted in the HPI. Review of Systems  Objective: Vital Signs: There were no vitals taken for this visit.  Specialty Comments:  No specialty comments available.  PMFS  History: Patient Active Problem List   Diagnosis Date Noted   Controlled type 2 diabetes mellitus without complication, without long-term current use of insulin (HCC) 09/29/2021   Hypertension associated with diabetes (HCC) 09/29/2021   Hyperlipidemia associated with type 2 diabetes mellitus (HCC) 09/29/2021   Morbid obesity (HCC) 09/29/2021   Past Medical History:  Diagnosis Date   Allergy    SEASONAL   Arthritis    SHOULDERS   Diabetes mellitus without complication (HCC)    Hyperlipidemia    Hypertension    Sleep apnea     Family History  Problem Relation Age of Onset   Lung cancer Maternal Aunt    Lung cancer Maternal Uncle    Breast cancer Maternal Grandmother    Colon cancer Neg Hx    Colon polyps Neg Hx    Crohn's disease Neg Hx    Esophageal cancer Neg Hx    Rectal cancer Neg Hx    Stomach cancer Neg Hx    Ulcerative colitis Neg Hx     Past Surgical History:  Procedure Laterality Date   ABDOMINAL HYSTERECTOMY     partial. fibroids   ANKLE SURGERY Left    screws to straighten   CHOLECYSTECTOMY     KNEE ARTHROSCOPY Right    Social History   Occupational History   Not on file  Tobacco Use   Smoking status: Never    Passive exposure: Past   Smokeless tobacco: Never  Vaping Use   Vaping status: Never Used  Substance and Sexual Activity   Alcohol use: Yes    Comment: occasoinally   Drug use: Never   Sexual activity: Yes

## 2023-07-13 ENCOUNTER — Other Ambulatory Visit (HOSPITAL_COMMUNITY): Payer: Self-pay

## 2023-07-23 ENCOUNTER — Telehealth: Payer: Self-pay | Admitting: Orthopedic Surgery

## 2023-07-23 NOTE — Telephone Encounter (Signed)
[employer] forms received. To [employer]. 

## 2023-07-25 ENCOUNTER — Telehealth: Payer: Self-pay | Admitting: Family Medicine

## 2023-07-25 NOTE — Telephone Encounter (Signed)
Form placed on provider's desk for review and signature. 

## 2023-07-25 NOTE — Telephone Encounter (Signed)
Patient dropped off document  Disability paperwork , to be filled out by provider. Patient requested to send it back via Fax within 5-days. Document is located in providers tray at front office.Please advise

## 2023-07-26 ENCOUNTER — Encounter: Payer: Self-pay | Admitting: *Deleted

## 2023-07-26 NOTE — Telephone Encounter (Signed)
Left message to return call to office.

## 2023-07-26 NOTE — Telephone Encounter (Signed)
Spoke to patient and she stated that form was supposed to be sent to ortho, not here.

## 2023-07-31 ENCOUNTER — Other Ambulatory Visit (HOSPITAL_COMMUNITY): Payer: Self-pay

## 2023-07-31 ENCOUNTER — Other Ambulatory Visit: Payer: Self-pay

## 2023-07-31 ENCOUNTER — Telehealth: Payer: Self-pay

## 2023-07-31 NOTE — Telephone Encounter (Signed)
Called and lm on vm to advise that [employer] forms are at the front desk for pick up whenever she has time. To call with questions.

## 2023-08-02 ENCOUNTER — Encounter (HOSPITAL_COMMUNITY): Payer: Self-pay | Admitting: Orthopedic Surgery

## 2023-08-02 ENCOUNTER — Other Ambulatory Visit: Payer: Self-pay

## 2023-08-02 NOTE — Anesthesia Preprocedure Evaluation (Addendum)
Anesthesia Evaluation  Patient identified by MRN, date of birth, ID band Patient awake    Reviewed: Allergy & Precautions, H&P , NPO status , Patient's Chart, lab work & pertinent test results  Airway Mallampati: II  TM Distance: >3 FB Neck ROM: Full    Dental no notable dental hx. (+) Teeth Intact, Poor Dentition, Dental Advisory Given, Missing,    Pulmonary neg pulmonary ROS, sleep apnea    Pulmonary exam normal breath sounds clear to auscultation       Cardiovascular Exercise Tolerance: Good hypertension, Pt. on medications negative cardio ROS Normal cardiovascular exam+ dysrhythmias  Rhythm:Regular Rate:Normal     Neuro/Psych negative neurological ROS  negative psych ROS   GI/Hepatic negative GI ROS, Neg liver ROS,,,  Endo/Other  negative endocrine ROSdiabetes, Well Controlled, Type 2  Morbid obesity  Renal/GU negative Renal ROS  negative genitourinary   Musculoskeletal negative musculoskeletal ROS (+) Arthritis ,    Abdominal  (+) + obese  Peds negative pediatric ROS (+)  Hematology negative hematology ROS (+)   Anesthesia Other Findings   Reproductive/Obstetrics negative OB ROS                             Anesthesia Physical Anesthesia Plan  ASA: 3  Anesthesia Plan: General   Post-op Pain Management: Minimal or no pain anticipated, Tylenol PO (pre-op)* and Celebrex PO (pre-op)*   Induction: Intravenous  PONV Risk Score and Plan: 3 and Ondansetron and Dexamethasone  Airway Management Planned: LMA and Oral ETT  Additional Equipment: None  Intra-op Plan:   Post-operative Plan: Extubation in OR  Informed Consent: I have reviewed the patients History and Physical, chart, labs and discussed the procedure including the risks, benefits and alternatives for the proposed anesthesia with the patient or authorized representative who has indicated his/her understanding and  acceptance.       Plan Discussed with: CRNA and Anesthesiologist  Anesthesia Plan Comments: ( )       Anesthesia Quick Evaluation

## 2023-08-02 NOTE — Progress Notes (Signed)
Ms Stephanie Mccall denies chest pain or shortness of breath.Patient denies having any s/s of Covid in her household, also denies any known exposure to Covid. Ms. Stephanie Mccall denies  any s/s of upper or lower respiratory infection in the past 8 weeks.  Ms. Stephanie Mccall' PCP is Dr. Joana Reamer.  Ms. Stephanie Mccall has type II DM, patient was not given any instructions to hold Ozempic, last dose  was Sunday, September 15th. I instructed patient to not take any medications for diabetes on am of surgery.  Ms Stephanie Mccall reports that CBGs range from 89 -189. I instructed Ms Stephanie Mccall to check CBG after awaking and every 2 hours until arrival  to the hospital.  I Instructed patient if CBG is less than 70 to take 4 Glucose Tablets or 1 tube of Glucose Gel or 1/2 cup of a clear juice. Recheck CBG in 15 minutes if CBG is not over 70 call, pre- op desk at [phone] for further instructions.

## 2023-08-03 ENCOUNTER — Ambulatory Visit (HOSPITAL_COMMUNITY)
Admission: RE | Admit: 2023-08-03 | Discharge: 2023-08-03 | Disposition: A | Discharge status: Home / Self Care | Payer: PRIVATE HEALTH INSURANCE | Attending: Orthopedic Surgery | Admitting: Orthopedic Surgery

## 2023-08-03 ENCOUNTER — Telehealth: Payer: Self-pay | Admitting: Orthopedic Surgery

## 2023-08-03 ENCOUNTER — Encounter (HOSPITAL_COMMUNITY)
Admission: RE | Disposition: A | Discharge status: Home / Self Care | Payer: Self-pay | Source: Home / Self Care | Attending: Orthopedic Surgery

## 2023-08-03 ENCOUNTER — Ambulatory Visit (HOSPITAL_BASED_OUTPATIENT_CLINIC_OR_DEPARTMENT_OTHER): Payer: PRIVATE HEALTH INSURANCE | Admitting: Anesthesiology

## 2023-08-03 ENCOUNTER — Ambulatory Visit (HOSPITAL_COMMUNITY): Payer: PRIVATE HEALTH INSURANCE

## 2023-08-03 ENCOUNTER — Telehealth: Payer: Self-pay | Admitting: Family

## 2023-08-03 ENCOUNTER — Ambulatory Visit (HOSPITAL_COMMUNITY): Payer: PRIVATE HEALTH INSURANCE | Admitting: Anesthesiology

## 2023-08-03 ENCOUNTER — Encounter (HOSPITAL_COMMUNITY): Payer: Self-pay | Admitting: Orthopedic Surgery

## 2023-08-03 ENCOUNTER — Other Ambulatory Visit (HOSPITAL_COMMUNITY): Payer: Self-pay

## 2023-08-03 ENCOUNTER — Other Ambulatory Visit: Payer: Self-pay

## 2023-08-03 DIAGNOSIS — T85848A Pain due to other internal prosthetic devices, implants and grafts, initial encounter: Secondary | ICD-10-CM

## 2023-08-03 DIAGNOSIS — M1990 Unspecified osteoarthritis, unspecified site: Secondary | ICD-10-CM | POA: Diagnosis not present

## 2023-08-03 DIAGNOSIS — G4730 Sleep apnea, unspecified: Secondary | ICD-10-CM | POA: Insufficient documentation

## 2023-08-03 DIAGNOSIS — M7672 Peroneal tendinitis, left leg: Secondary | ICD-10-CM | POA: Diagnosis not present

## 2023-08-03 DIAGNOSIS — M7732 Calcaneal spur, left foot: Secondary | ICD-10-CM | POA: Diagnosis not present

## 2023-08-03 DIAGNOSIS — I10 Essential (primary) hypertension: Secondary | ICD-10-CM | POA: Insufficient documentation

## 2023-08-03 DIAGNOSIS — Z6841 Body Mass Index (BMI) 40.0 and over, adult: Secondary | ICD-10-CM | POA: Insufficient documentation

## 2023-08-03 DIAGNOSIS — E119 Type 2 diabetes mellitus without complications: Secondary | ICD-10-CM | POA: Diagnosis not present

## 2023-08-03 DIAGNOSIS — M8938 Hypertrophy of bone, other site: Secondary | ICD-10-CM | POA: Insufficient documentation

## 2023-08-03 DIAGNOSIS — T8484XA Pain due to internal orthopedic prosthetic devices, implants and grafts, initial encounter: Secondary | ICD-10-CM | POA: Diagnosis present

## 2023-08-03 DIAGNOSIS — Y831 Surgical operation with implant of artificial internal device as the cause of abnormal reaction of the patient, or of later complication, without mention of misadventure at the time of the procedure: Secondary | ICD-10-CM | POA: Insufficient documentation

## 2023-08-03 DIAGNOSIS — M7752 Other enthesopathy of left foot: Secondary | ICD-10-CM

## 2023-08-03 DIAGNOSIS — Z7984 Long term (current) use of oral hypoglycemic drugs: Secondary | ICD-10-CM | POA: Diagnosis not present

## 2023-08-03 DIAGNOSIS — Z7985 Long-term (current) use of injectable non-insulin antidiabetic drugs: Secondary | ICD-10-CM | POA: Insufficient documentation

## 2023-08-03 HISTORY — DX: Cardiac arrhythmia, unspecified: I49.9

## 2023-08-03 HISTORY — PX: HARDWARE REMOVAL: SHX979

## 2023-08-03 LAB — GLUCOSE, CAPILLARY
Glucose-Capillary: 147 mg/dL — ABNORMAL HIGH (ref 70–99)
Glucose-Capillary: 144 mg/dL — ABNORMAL HIGH (ref 70–99)

## 2023-08-03 SURGERY — REMOVAL, HARDWARE
Anesthesia: General | Laterality: Left

## 2023-08-03 MED ORDER — VANCOMYCIN HCL 500 MG IV SOLR
INTRAVENOUS | Status: DC | PRN
  Administered 2023-08-03: 1000 mg via TOPICAL

## 2023-08-03 MED ORDER — FENTANYL CITRATE (PF) 100 MCG/2ML IJ SOLN
25.0000 ug | INTRAMUSCULAR | Status: DC | PRN
  Administered 2023-08-03 (×2): 25 ug via INTRAVENOUS
  Administered 2023-08-03: 50 ug via INTRAVENOUS
  Administered 2023-08-03: 25 ug via INTRAVENOUS

## 2023-08-03 MED ORDER — OXYCODONE HCL 5 MG PO TABS: 5.0000 mg | ORAL_TABLET | Freq: Once | ORAL | Status: AC | PRN

## 2023-08-03 MED ORDER — MIDAZOLAM HCL 2 MG/2ML IJ SOLN
INTRAMUSCULAR | Status: DC | PRN
  Administered 2023-08-03: 2 mg via INTRAVENOUS

## 2023-08-03 MED ORDER — CELECOXIB 200 MG PO CAPS
200.0000 mg | ORAL_CAPSULE | Freq: Once | ORAL | Status: AC
  Administered 2023-08-03: 200 mg via ORAL
  Filled 2023-08-03: qty 1

## 2023-08-03 MED ORDER — 0.9 % SODIUM CHLORIDE (POUR BTL) OPTIME
TOPICAL | Status: DC | PRN
Start: 2023-08-03 — End: 2023-08-03
  Administered 2023-08-03: 1000 mL

## 2023-08-03 MED ORDER — FENTANYL CITRATE (PF) 100 MCG/2ML IJ SOLN
INTRAMUSCULAR | Status: AC
  Filled 2023-08-03: qty 2

## 2023-08-03 MED ORDER — EPHEDRINE SULFATE-NACL 50-0.9 MG/10ML-% IV SOSY
PREFILLED_SYRINGE | INTRAVENOUS | Status: DC | PRN
  Administered 2023-08-03 (×2): 10 mg via INTRAVENOUS
  Administered 2023-08-03: 5 mg via INTRAVENOUS

## 2023-08-03 MED ORDER — OXYCODONE HCL 5 MG/5ML PO SOLN
ORAL | Status: AC
  Filled 2023-08-03: qty 5

## 2023-08-03 MED ORDER — ONDANSETRON HCL 4 MG/2ML IJ SOLN: 4.0000 mg | Freq: Once | INTRAMUSCULAR | Status: DC | PRN

## 2023-08-03 MED ORDER — ONDANSETRON HCL 4 MG/2ML IJ SOLN
INTRAMUSCULAR | Status: DC | PRN
  Administered 2023-08-03: 4 mg via INTRAVENOUS

## 2023-08-03 MED ORDER — FENTANYL CITRATE (PF) 250 MCG/5ML IJ SOLN
INTRAMUSCULAR | Status: DC | PRN
  Administered 2023-08-03 (×3): 50 ug via INTRAVENOUS

## 2023-08-03 MED ORDER — MEPERIDINE HCL 25 MG/ML IJ SOLN: 6.2500 mg | INTRAMUSCULAR | Status: DC | PRN

## 2023-08-03 MED ORDER — DEXAMETHASONE SODIUM PHOSPHATE 10 MG/ML IJ SOLN
INTRAMUSCULAR | Status: DC | PRN
  Administered 2023-08-03: 4 mg via INTRAVENOUS

## 2023-08-03 MED ORDER — CEFAZOLIN SODIUM-DEXTROSE 2-4 GM/100ML-% IV SOLN
2.0000 g | INTRAVENOUS | Status: AC
  Administered 2023-08-03: 2 g via INTRAVENOUS
  Filled 2023-08-03: qty 100

## 2023-08-03 MED ORDER — FENTANYL CITRATE (PF) 250 MCG/5ML IJ SOLN
INTRAMUSCULAR | Status: AC
  Filled 2023-08-03: qty 5

## 2023-08-03 MED ORDER — PROPOFOL 10 MG/ML IV BOLUS
INTRAVENOUS | Status: DC | PRN
  Administered 2023-08-03: 200 mg via INTRAVENOUS

## 2023-08-03 MED ORDER — MIDAZOLAM HCL 2 MG/2ML IJ SOLN
INTRAMUSCULAR | Status: AC
  Filled 2023-08-03: qty 2

## 2023-08-03 MED ORDER — DEXMEDETOMIDINE HCL IN NACL 80 MCG/20ML IV SOLN
INTRAVENOUS | Status: DC | PRN
Start: 2023-08-03 — End: 2023-08-03
  Administered 2023-08-03: 8 ug via INTRAVENOUS
  Administered 2023-08-03 (×2): 4 ug via INTRAVENOUS

## 2023-08-03 MED ORDER — OXYCODONE HCL 5 MG/5ML PO SOLN
5.0000 mg | Freq: Once | ORAL | Status: AC | PRN
  Administered 2023-08-03: 5 mg via ORAL

## 2023-08-03 MED ORDER — INSULIN ASPART 100 UNIT/ML IJ SOLN
0.0000 [IU] | INTRAMUSCULAR | Status: DC | PRN
  Administered 2023-08-03: 2 [IU] via SUBCUTANEOUS
  Filled 2023-08-03: qty 1

## 2023-08-03 MED ORDER — ACETAMINOPHEN 500 MG PO TABS
1000.0000 mg | ORAL_TABLET | Freq: Once | ORAL | Status: AC
  Administered 2023-08-03: 1000 mg via ORAL
  Filled 2023-08-03: qty 2

## 2023-08-03 MED ORDER — LACTATED RINGERS IV SOLN: INTRAVENOUS | Status: DC

## 2023-08-03 MED ORDER — ORAL CARE MOUTH RINSE: 15.0000 mL | Freq: Once | OROMUCOSAL | Status: AC

## 2023-08-03 MED ORDER — PROPOFOL 10 MG/ML IV BOLUS
INTRAVENOUS | Status: AC
  Filled 2023-08-03: qty 20

## 2023-08-03 MED ORDER — ACETAMINOPHEN 325 MG PO TABS: 325.0000 mg | ORAL_TABLET | ORAL | Status: DC | PRN

## 2023-08-03 MED ORDER — ACETAMINOPHEN 160 MG/5ML PO SOLN: 325.0000 mg | ORAL | Status: DC | PRN

## 2023-08-03 MED ORDER — VANCOMYCIN HCL 1000 MG IV SOLR
INTRAVENOUS | Status: AC
  Filled 2023-08-03: qty 20

## 2023-08-03 MED ORDER — CHLORHEXIDINE GLUCONATE 0.12 % MT SOLN
15.0000 mL | Freq: Once | OROMUCOSAL | Status: AC
  Administered 2023-08-03: 15 mL via OROMUCOSAL
  Filled 2023-08-03: qty 15

## 2023-08-03 MED ORDER — OXYCODONE-ACETAMINOPHEN 5-325 MG PO TABS
1.0000 | ORAL_TABLET | ORAL | 0 refills | Status: DC | PRN
Start: 2023-08-03 — End: 2024-01-05
  Filled 2023-08-03: qty 30, 5d supply, fill #0

## 2023-08-03 SURGICAL SUPPLY — 56 items
BAG COUNTER SPONGE SURGICOUNT (BAG) ×2 IMPLANT
BAG SPNG CNTER NS LX DISP (BAG) ×2
BANDAGE ESMARK 6X9 LF (GAUZE/BANDAGES/DRESSINGS) IMPLANT
BLADE SAW SGTL HD 18.5X60.5X1. (BLADE) ×2 IMPLANT
BLADE SURG 10 STRL SS (BLADE) IMPLANT
BNDG CMPR 9X6 STRL LF SNTH (GAUZE/BANDAGES/DRESSINGS)
BNDG COHESIVE 4X5 TAN STRL (GAUZE/BANDAGES/DRESSINGS) ×2 IMPLANT
BNDG ESMARK 6X9 LF (GAUZE/BANDAGES/DRESSINGS)
BNDG GAUZE DERMACEA FLUFF 4 (GAUZE/BANDAGES/DRESSINGS) ×3 IMPLANT
BNDG GZE DERMACEA 4 6PLY (GAUZE/BANDAGES/DRESSINGS) ×2
COTTON STERILE ROLL (GAUZE/BANDAGES/DRESSINGS) ×2 IMPLANT
COVER MAYO STAND STRL (DRAPES) IMPLANT
COVER SURGICAL LIGHT HANDLE (MISCELLANEOUS) ×4 IMPLANT
CUFF TOURN SGL QUICK 34 (TOURNIQUET CUFF)
CUFF TOURN SGL QUICK 42 (TOURNIQUET CUFF) IMPLANT
CUFF TRNQT CYL 34X4.125X (TOURNIQUET CUFF) IMPLANT
DRAPE C-ARM 42X72 X-RAY (DRAPES) IMPLANT
DRAPE INCISE IOBAN 66X45 STRL (DRAPES) ×2 IMPLANT
DRAPE OEC MINIVIEW 54X84 (DRAPES) IMPLANT
DRAPE ORTHO SPLIT 77X108 STRL (DRAPES)
DRAPE SURG ORHT 6 SPLT 77X108 (DRAPES) IMPLANT
DRAPE U-SHAPE 47X51 STRL (DRAPES) ×2 IMPLANT
DRSG ADAPTIC 3X8 NADH LF (GAUZE/BANDAGES/DRESSINGS) ×2 IMPLANT
DRSG EMULSION OIL 3X3 NADH (GAUZE/BANDAGES/DRESSINGS) ×2 IMPLANT
DURAPREP 26ML APPLICATOR (WOUND CARE) ×2 IMPLANT
ELECT REM PT RETURN 9FT ADLT (ELECTROSURGICAL) ×2
ELECTRODE REM PT RTRN 9FT ADLT (ELECTROSURGICAL) ×2 IMPLANT
GAUZE PAD ABD 8X10 STRL (GAUZE/BANDAGES/DRESSINGS) ×2 IMPLANT
GAUZE SPONGE 4X4 12PLY STRL (GAUZE/BANDAGES/DRESSINGS) ×2 IMPLANT
GLOVE BIOGEL PI IND STRL 9 (GLOVE) ×2 IMPLANT
GLOVE SURG ORTHO 9.0 STRL STRW (GLOVE) ×2 IMPLANT
GOWN STRL REUS W/ TWL XL LVL3 (GOWN DISPOSABLE) ×6 IMPLANT
GOWN STRL REUS W/TWL XL LVL3 (GOWN DISPOSABLE) ×6
GRAFT SKIN WND MICRO 38 (Tissue) ×1 IMPLANT
KIT BASIN OR (CUSTOM PROCEDURE TRAY) ×2 IMPLANT
KIT TURNOVER KIT B (KITS) ×2 IMPLANT
MANIFOLD NEPTUNE II (INSTRUMENTS) ×2 IMPLANT
NS IRRIG 1000ML POUR BTL (IV SOLUTION) ×2 IMPLANT
PACK ORTHO EXTREMITY (CUSTOM PROCEDURE TRAY) ×2 IMPLANT
PAD ARMBOARD 7.5X6 YLW CONV (MISCELLANEOUS) ×4 IMPLANT
PAD CAST 4YDX4 CTTN HI CHSV (CAST SUPPLIES) ×2 IMPLANT
PADDING CAST COTTON 4X4 STRL (CAST SUPPLIES) ×2
SPONGE T-LAP 18X18 ~~LOC~~+RFID (SPONGE) ×2 IMPLANT
STAPLER VISISTAT 35W (STAPLE) IMPLANT
STOCKINETTE IMPERVIOUS 9X36 MD (GAUZE/BANDAGES/DRESSINGS) IMPLANT
SUCTION TUBE FRAZIER 10FR DISP (SUCTIONS) ×2 IMPLANT
SUT ETHILON 2 0 PSLX (SUTURE) ×6 IMPLANT
SUT VIC AB 0 CT1 27 (SUTURE)
SUT VIC AB 0 CT1 27XBRD ANBCTR (SUTURE) IMPLANT
SUT VIC AB 2-0 CT1 27 (SUTURE)
SUT VIC AB 2-0 CT1 TAPERPNT 27 (SUTURE) IMPLANT
TOWEL GREEN STERILE (TOWEL DISPOSABLE) ×2 IMPLANT
TOWEL GREEN STERILE FF (TOWEL DISPOSABLE) ×2 IMPLANT
TUBE CONNECTING 12X1/4 (SUCTIONS) ×2 IMPLANT
UNDERPAD 30X36 HEAVY ABSORB (UNDERPADS AND DIAPERS) ×2 IMPLANT
WATER STERILE IRR 1000ML POUR (IV SOLUTION) ×2 IMPLANT

## 2023-08-03 NOTE — Telephone Encounter (Signed)
Pt called requesting a call back. Pt had surgery today and need a 1 wk post op. Please call pt to set an appt at [phone].

## 2023-08-03 NOTE — H&P (Signed)
Stephanie Mccall is an 52 y.o. female.   Chief Complaint: Painful retained hardware left heel HPI: Patient is a 52 year old woman who is seen in follow-up for left foot and ankle pain. Patient is status post a calcaneal osteotomy x 2 with failed hardware laterally. Patient has pain over the retained hardware complains of pain over the peroneal tendons and pain along the base of the fifth metatarsal.   Past Medical History:  Diagnosis Date   Allergy    SEASONAL   Arthritis    SHOULDERS   Diabetes mellitus without complication (HCC)    Dysrhythmia    Hyperlipidemia    Hypertension    Sleep apnea     Past Surgical History:  Procedure Laterality Date   ABDOMINAL HYSTERECTOMY     partial. fibroids   ANKLE SURGERY Left    screws to straighten   CHOLECYSTECTOMY     KNEE ARTHROSCOPY Right     Family History  Problem Relation Age of Onset   Lung cancer Maternal Aunt    Lung cancer Maternal Uncle    Breast cancer Maternal Grandmother    Colon cancer Neg Hx    Colon polyps Neg Hx    Crohn's disease Neg Hx    Esophageal cancer Neg Hx    Rectal cancer Neg Hx    Stomach cancer Neg Hx    Ulcerative colitis Neg Hx    Social History:  reports that she has never smoked. She has been exposed to tobacco smoke. She has never used smokeless tobacco. She reports current alcohol use. She reports that she does not use drugs.  Allergies:  Allergies  Allergen Reactions   Nitroglycerin Itching    Medications Prior to Admission  Medication Sig Dispense Refill   acetaminophen (TYLENOL) 500 MG tablet Take 1,000 mg by mouth every 6 (six) hours as needed for headache, moderate pain or mild pain.     amLODipine (NORVASC) 10 MG tablet Take 1 tablet (10 mg total) by mouth daily. 90 tablet 3   glipiZIDE (GLUCOTROL XL) 2.5 MG 24 hr tablet Take 1 tablet (2.5 mg total) by mouth daily. 90 tablet 1   ketoconazole (NIZORAL) 2 % cream Apply 1 Application topically daily. Face/scalp rash (Patient taking  differently: Apply 1 Application topically daily as needed for irritation. Face/scalp rash) 15 g 1   ketoconazole (NIZORAL) 2 % shampoo Apply 1 Application topically 2 (two) times a week. (Patient taking differently: Apply 1 Application topically daily as needed for irritation.) 120 mL 3   losartan-hydrochlorothiazide (HYZAAR) 100-25 MG tablet Take 1 tablet by mouth daily. 90 tablet 3   meloxicam (MOBIC) 15 MG tablet Take 1 tablet (15 mg total) by mouth daily. 30 tablet 0   metFORMIN (GLUCOPHAGE) 1000 MG tablet Take 1 tablet (1,000 mg total) by mouth 2 (two) times daily. 180 tablet 0   potassium chloride (KLOR-CON M) 10 MEQ tablet Take 1 tablet (10 mEq total) by mouth 2 (two) times daily. 90 tablet 1   pravastatin (PRAVACHOL) 80 MG tablet Take 1 tablet (80 mg total) by mouth daily. 90 tablet 3   Semaglutide, 2 MG/DOSE, (OZEMPIC, 2 MG/DOSE,) 8 MG/3ML SOPN Inject 2 mg into the skin once a week. (Patient taking differently: Inject 2 mg into the skin once a week. Sunday) 9 mL 3   Accu-Chek Softclix Lancets lancets Test up to twice daily as directed in the morning fasting and when hypoglycemic 100 each 1   aspirin-acetaminophen-caffeine (EXCEDRIN MIGRAINE) 250-250-65 MG tablet Take 2 tablets  by mouth every 6 (six) hours as needed for headache.     Blood Glucose Monitoring Suppl (BLOOD GLUCOSE MONITOR SYSTEM) w/Device KIT Test up to twice daily as directed in the morning fasting and when hypoglycemic 1 kit 0   glucose blood test strip Test up to twice daily as directed in the morning fasting and when hypoglycemic 100 each 1   ibuprofen (ADVIL) 800 MG tablet Take 1 tablet (800 mg total) by mouth every 8 (eight) hours as needed for mild to moderate pain for up to 10 days (Patient not taking: Reported on 07/30/2023) 30 tablet 3   triamcinolone cream (KENALOG) 0.1 % Apply 1 Application topically 2 (two) times daily. For rash (Patient not taking: Reported on 07/30/2023) 30 g 0    Results for orders placed or  performed during the hospital encounter of 08/03/23 (from the past 48 hour(s))  Glucose, capillary     Status: Abnormal   Collection Time: 08/03/23  5:54 AM  Result Value Ref Range   Glucose-Capillary 147 (H) 70 - 99 mg/dL    Comment: Glucose reference range applies only to samples taken after fasting for at least 8 hours.   No results found.  Review of Systems  All other systems reviewed and are negative.   Blood pressure (!) 166/93, temperature 98.6 F (37 C), temperature source Oral, resp. rate 18, height 5\' 6"  (1.676 m), weight 116.1 kg, SpO2 97%. Physical Exam  Patient is alert, oriented, no adenopathy, well-dressed, normal affect, normal respiratory effort. Examination patient has a good palpable pulse she has tenderness to palpation along the peroneal tendons however there is no swelling.  She has pain over the retained hardware as well as pain over the calcaneocuboid joint.  She has 2 vertical incisions.  Radiograph shows failure of the hardware distal calcaneus. Assessment/Plan 1. Pain in left ankle and joints of left foot   2. Peroneal tendinitis of left lower extremity       Plan: Patient states she would like to proceed with surgical intervention.  Recommended surgery would be to remove the broken retained hardware lateral calcaneus plan for fusion of the calcaneocuboid joint and evaluate the peroneal tendons.  May need to debride the tendons versus reinforce the peroneus brevis with the peroneus longus.  Discussed that she would be nonweightbearing for 4 weeks out of work for 2 to 3 months.  Patient works as a Lawyer.  She states she would like to proceed with surgery in September.  Nadara Mustard, MD 08/03/2023, 6:45 AM

## 2023-08-03 NOTE — Telephone Encounter (Signed)
Can you please call pt and advise that I have sch appt for post op Friday 08/10/2023 at 10:15 with NP Erin. Thanks!

## 2023-08-03 NOTE — Anesthesia Procedure Notes (Signed)
Procedure Name: LMA Insertion Date/Time: 08/03/2023 7:43 AM  Performed by: Sandie Ano, CRNAPre-anesthesia Checklist: Patient identified, Emergency Drugs available, Suction available and Patient being monitored Patient Re-evaluated:Patient Re-evaluated prior to induction Oxygen Delivery Method: Circle System Utilized Preoxygenation: Pre-oxygenation with 100% oxygen Induction Type: IV induction Ventilation: Mask ventilation without difficulty LMA: LMA inserted LMA Size: 4.0 Number of attempts: 1 Airway Equipment and Method: Bite block Placement Confirmation: positive ETCO2 Tube secured with: Tape Dental Injury: Teeth and Oropharynx as per pre-operative assessment

## 2023-08-03 NOTE — Telephone Encounter (Signed)
Pt called. Informed pt she has an appt 9/27 at 10:15 am

## 2023-08-03 NOTE — Op Note (Signed)
08/03/2023  9:17 AM  PATIENT:  Stephanie Mccall    PRE-OPERATIVE DIAGNOSIS:  Painful Hardware Left Foot with peroneal tendinitis and bony overgrowth of the calcaneus  POST-OPERATIVE DIAGNOSIS:  Same  PROCEDURE:  REMOVAL DEEP HARDWARE LEFT FOOT which included 2 plates and multiple screws. Ostectomy left calcaneus for bony overgrowth laterally. Application Kerecis micro graft 38 cm and vancomycin powder 1 g. Synovectomy of the peroneal tendons with intact peroneal tendons. C-arm fluoroscopy to verify removal of all hardware.  SURGEON:  Nadara Mustard, MD  PHYSICIAN ASSISTANT:None ANESTHESIA:   General  PREOPERATIVE INDICATIONS:  Stephanie Mccall is a  52 y.o. female with a diagnosis of Painful Hardware Left Foot Calcaneal Cuboid Fusion who failed conservative measures and elected for surgical management.    The risks benefits and alternatives were discussed with the patient preoperatively including but not limited to the risks of infection, bleeding, nerve injury, cardiopulmonary complications, the need for revision surgery, among others, and the patient was willing to proceed.  OPERATIVE IMPLANTS:   Implant Name Type Inv. Item Serial No. Manufacturer Lot No. LRB No. Used Action  GRAFT SKIN WND MICRO 38 - [id number] Tissue GRAFT SKIN WND MICRO 38  [employer] [id number] Left 1 Implanted    @ENCIMAGES @  OPERATIVE FINDINGS: Patient had synovitis of the peroneal tendons that was debrided.  The tendons were intact with no fraying of the tendon.  There was 1 loose screw in the soft tissue that was removed the other hardware and screws were removed with ostectomy included for bony overgrowth.  Multiple trays were opened that were felt to be contaminated.  The wound was irrigated with Vashe and vancomycin powder was placed within the wound as a precaution.  OPERATIVE PROCEDURE: Patient was brought the operating room underwent a general anesthetic.  After adequate levels anesthesia were obtained  patient's left lower extremity was prepped using DuraPrep draped into a sterile field a timeout was called.  An extensile incision was made that incorporated one of the previous incisions.  Subperiosteal dissection was then carried out along the calcaneus.  The peroneal tendons were elevated.  There was extensive bony overgrowth of the plates and a osteotome was used to remove the bony overgrowth of the calcaneus.  The 2 deep plates were then removed and there were associated screws.  There was a screw that was in the soft tissue and this was also removed.  After removal of all hardware and removal of the bony overgrowth the peroneal tendons were inspected.  There was synovitis and a synovectomy was performed along the peroneal tendons.  The subtalar joint was inspected.  The tendons were intact.  There was extensive synovitis within the subtalar joint and this was removed as well.C-arm fluoroscopy verified removal of all hardware.  The wound was then irrigated with Vashe irrigation.  The wound bed was then filled with 1 g vancomycin powder and 38 cm of Kerecis micro graft.  Using a Algower Donati suture technique the incision was closed using 2-0 nylon.  A sterile dressing was applied patient was extubated taken the PACU in stable condition.   DISCHARGE PLANNING:  Antibiotic duration: Preoperative antibiotics.  Weightbearing: Touchdown weightbearing on the left  Pain medication: Prescription for Percocet  Dressing care/ Wound VAC: Dry dressing  Ambulatory devices: Crutches and Cam walker  Discharge to: Home.  Follow-up: In the office 1 week post operative.

## 2023-08-03 NOTE — Transfer of Care (Signed)
Immediate Anesthesia Transfer of Care Note  Patient: Stephanie Mccall  Procedure(s) Performed: REMOVAL HARDWARE LEFT FOOT (Left)  Patient Location: PACU  Anesthesia Type:General  Level of Consciousness: awake and patient cooperative  Airway & Oxygen Therapy: Patient Spontanous Breathing  Post-op Assessment: Report given to RN and Post -op Vital signs reviewed and stable  Post vital signs: Reviewed and stable  Last Vitals:  Vitals Value Taken Time  BP 120/60 08/03/23 0930  Temp    Pulse 88 08/03/23 0931  Resp 17 08/03/23 0931  SpO2 94 % 08/03/23 0931  Vitals shown include unfiled device data.  Last Pain:  Vitals:   08/03/23 0616  TempSrc:   PainSc: 0-No pain         Complications: No notable events documented.

## 2023-08-04 ENCOUNTER — Encounter (HOSPITAL_COMMUNITY): Payer: Self-pay | Admitting: Orthopedic Surgery

## 2023-08-06 ENCOUNTER — Telehealth: Payer: Self-pay

## 2023-08-06 NOTE — Telephone Encounter (Signed)
Patient had surgery with Dr.Duda on 08/03/2023, hardware removal. She wants to know how long she needs to wear the boot and can she remove it? [phone] Thanks!

## 2023-08-06 NOTE — Telephone Encounter (Signed)
I called pt and advised to wear the boot during the day and she can remove for sleep. Pt has a follow up in the office on Friday.

## 2023-08-07 ENCOUNTER — Telehealth: Payer: Self-pay | Admitting: Orthopedic Surgery

## 2023-08-07 NOTE — Anesthesia Postprocedure Evaluation (Signed)
Anesthesia Post Note  Patient: Stephanie Mccall  Procedure(s) Performed: REMOVAL HARDWARE LEFT FOOT (Left)     Patient location during evaluation: PACU Anesthesia Type: General Level of consciousness: awake and alert Pain management: pain level controlled Vital Signs Assessment: post-procedure vital signs reviewed and stable Respiratory status: spontaneous breathing, nonlabored ventilation, respiratory function stable and patient connected to nasal cannula oxygen Cardiovascular status: blood pressure returned to baseline and stable Postop Assessment: no apparent nausea or vomiting Anesthetic complications: no   No notable events documented.  Last Vitals:  Vitals:   08/03/23 1015 08/03/23 1030  BP: 120/74 124/75  Pulse: 69 72  Resp: 12 15  Temp:  36.7 C  SpO2: 99% 93%    Last Pain:  Vitals:   08/03/23 1030  TempSrc:   PainSc: 5                  Claudette Wermuth

## 2023-08-07 NOTE — Telephone Encounter (Signed)
error 

## 2023-08-10 ENCOUNTER — Other Ambulatory Visit (HOSPITAL_COMMUNITY): Payer: Self-pay

## 2023-08-10 ENCOUNTER — Ambulatory Visit (INDEPENDENT_AMBULATORY_CARE_PROVIDER_SITE_OTHER): Payer: PRIVATE HEALTH INSURANCE | Admitting: Family

## 2023-08-10 ENCOUNTER — Encounter: Payer: Self-pay | Admitting: Family

## 2023-08-10 DIAGNOSIS — T85848D Pain due to other internal prosthetic devices, implants and grafts, subsequent encounter: Secondary | ICD-10-CM

## 2023-08-14 NOTE — Progress Notes (Addendum)
   Post-Op Visit Note   Patient: Stephanie Mccall           Date of Birth: 05-24-1971           MRN: [id number] Visit Date: 08/10/2023 PCP: Jeani Sow, MD  Chief Complaint:  Chief Complaint  Patient presents with   Left Foot - Routine Post Op    08/03/23 removal of hardware left foot    HPI:  HPI The patient is a 52 year old woman who is seen status post removal hardware left foot she has been nonweightbearing in her cam walker using a kneeling scooter Ortho Exam On examination of the left foot her incision is well-approximated sutures there is no gaping or drainage no erythema no edema  Visit Diagnoses: No diagnosis found.  Plan: Begin daily Dial soap cleansing.  Dry dressings.  Continue nonweightbearing.  Continue cam walker.  Radiographs at follow up   Follow-Up Instructions: Return in about 10 days (around 08/20/2023).   Imaging: No results found.  Orders:  No orders of the defined types were placed in this encounter.  No orders of the defined types were placed in this encounter.    PMFS History: Patient Active Problem List   Diagnosis Date Noted   Pain from implanted hardware 08/03/2023   Calcaneal spur of foot, left 08/03/2023   Tendonitis of ankle, left 08/03/2023   Controlled type 2 diabetes mellitus without complication, without long-term current use of insulin (HCC) 09/29/2021   Hypertension associated with diabetes (HCC) 09/29/2021   Hyperlipidemia associated with type 2 diabetes mellitus (HCC) 09/29/2021   Morbid obesity (HCC) 09/29/2021   Past Medical History:  Diagnosis Date   Allergy    SEASONAL   Arthritis    SHOULDERS   Diabetes mellitus without complication (HCC)    Dysrhythmia    Hyperlipidemia    Hypertension    Sleep apnea     Family History  Problem Relation Age of Onset   Lung cancer Maternal Aunt    Lung cancer Maternal Uncle    Breast cancer Maternal Grandmother    Colon cancer Neg Hx    Colon polyps Neg Hx    Crohn's  disease Neg Hx    Esophageal cancer Neg Hx    Rectal cancer Neg Hx    Stomach cancer Neg Hx    Ulcerative colitis Neg Hx     Past Surgical History:  Procedure Laterality Date   ABDOMINAL HYSTERECTOMY     partial. fibroids   ANKLE SURGERY Left    screws to straighten   CHOLECYSTECTOMY     HARDWARE REMOVAL Left 08/03/2023   Procedure: REMOVAL HARDWARE LEFT FOOT;  Surgeon: Nadara Mustard, MD;  Location: [facility] OR;  Service: Orthopedics;  Laterality: Left;   KNEE ARTHROSCOPY Right    Social History   Occupational History   Not on file  Tobacco Use   Smoking status: Never    Passive exposure: Past   Smokeless tobacco: Never  Vaping Use   Vaping status: Not on file  Substance and Sexual Activity   Alcohol use: Yes    Comment: occasoinally   Drug use: Never   Sexual activity: Yes

## 2023-08-27 ENCOUNTER — Encounter: Payer: Self-pay | Admitting: Orthopedic Surgery

## 2023-08-27 ENCOUNTER — Ambulatory Visit: Payer: PRIVATE HEALTH INSURANCE | Admitting: Orthopedic Surgery

## 2023-08-27 ENCOUNTER — Other Ambulatory Visit (INDEPENDENT_AMBULATORY_CARE_PROVIDER_SITE_OTHER): Payer: Self-pay

## 2023-08-27 DIAGNOSIS — T85848D Pain due to other internal prosthetic devices, implants and grafts, subsequent encounter: Secondary | ICD-10-CM

## 2023-08-27 DIAGNOSIS — M25512 Pain in left shoulder: Secondary | ICD-10-CM

## 2023-08-27 DIAGNOSIS — G8929 Other chronic pain: Secondary | ICD-10-CM

## 2023-08-27 NOTE — Progress Notes (Signed)
Office Visit Note   Patient: Stephanie Mccall           Date of Birth: 1971/02/16           MRN: [id number] Visit Date: 08/27/2023              Requested by: Jeani Sow, MD [address] [place],  [place] [address] PCP: Jeani Sow, MD  Chief Complaint  Patient presents with   Left Foot - Routine Post Op    08/03/23 removal of hardware left foot      HPI: Patient is a 52 year old woman who is seen in follow-up status post removal deep retained hardware left calcaneus.  She 3 weeks out from surgery.  Patient also complains of persistent left shoulder pain.  She has had several months of relief with previous subacromial injections however the most recent subacromial injection only provided her 2 days of relief.  Assessment & Plan: Visit Diagnoses:  1. Pain from implanted hardware, subsequent encounter   2. Chronic left shoulder pain     Plan: A order is placed for an MRI scan of the left shoulder and she will call to follow-up after this is obtained.  She is provided a note to return to work on December 2 without restrictions.  Follow-Up Instructions: Return if symptoms worsen or fail to improve.   Ortho Exam  Patient is alert, oriented, no adenopathy, well-dressed, normal affect, normal respiratory effort. Examination the incision is well-healed there is no keloiding.  We will harvest the sutures today recommended scar massage with shave butter or cocoa butter.  Patient's left shoulder is still symptomatic consistent with rotator cuff pathology.  Will order an MRI scan.  Imaging: XR Foot Complete Left  Result Date: 08/27/2023 Three-view radiographs of the left foot shows removal of all retained hardware from the calcaneus.  No images are attached to the encounter.  Labs: Lab Results  Component Value Date   HGBA1C 7.4 (H) 07/03/2023   HGBA1C 7.0 (H) 01/09/2023   HGBA1C 8.4 (H) 06/28/2022     Lab Results  Component Value Date   ALBUMIN 4.6 07/03/2023    ALBUMIN 4.5 01/09/2023   ALBUMIN 4.1 06/28/2022    No results found for: "MG" Lab Results  Component Value Date   VD25OH 31.72 10/14/2021    No results found for: "PREALBUMIN"    Latest Ref Rng & Units 07/03/2023    3:53 PM 01/09/2023   10:14 AM 04/05/2022    9:35 AM  CBC EXTENDED  WBC 4.0 - 10.5 K/uL 6.4  3.8  4.6   RBC 3.87 - 5.11 Mil/uL 4.87  5.32  5.18   Hemoglobin 12.0 - 15.0 g/dL 52.8  41.3  24.4   HCT 36.0 - 46.0 % 41.7  45.5  45.2   Platelets 150.0 - 400.0 K/uL 311.0  284.0  259   NEUT# 1.4 - 7.7 K/uL 3.7     Lymph# 0.7 - 4.0 K/uL 2.2        There is no height or weight on file to calculate BMI.  Orders:  Orders Placed This Encounter  Procedures   XR Foot Complete Left   No orders of the defined types were placed in this encounter.    Procedures: No procedures performed  Clinical Data: No additional findings.  ROS:  All other systems negative, except as noted in the HPI. Review of Systems  Objective: Vital Signs: There were no vitals taken for this visit.  Specialty Comments:  No specialty comments available.  PMFS History: Patient Active Problem List   Diagnosis Date Noted   Pain from implanted hardware 08/03/2023   Calcaneal spur of foot, left 08/03/2023   Tendonitis of ankle, left 08/03/2023   Controlled type 2 diabetes mellitus without complication, without long-term current use of insulin (HCC) 09/29/2021   Hypertension associated with diabetes (HCC) 09/29/2021   Hyperlipidemia associated with type 2 diabetes mellitus (HCC) 09/29/2021   Morbid obesity (HCC) 09/29/2021   Past Medical History:  Diagnosis Date   Allergy    SEASONAL   Arthritis    SHOULDERS   Diabetes mellitus without complication (HCC)    Dysrhythmia    Hyperlipidemia    Hypertension    Sleep apnea     Family History  Problem Relation Age of Onset   Lung cancer Maternal Aunt    Lung cancer Maternal Uncle    Breast cancer Maternal Grandmother    Colon cancer Neg  Hx    Colon polyps Neg Hx    Crohn's disease Neg Hx    Esophageal cancer Neg Hx    Rectal cancer Neg Hx    Stomach cancer Neg Hx    Ulcerative colitis Neg Hx     Past Surgical History:  Procedure Laterality Date   ABDOMINAL HYSTERECTOMY     partial. fibroids   ANKLE SURGERY Left    screws to straighten   CHOLECYSTECTOMY     HARDWARE REMOVAL Left 08/03/2023   Procedure: REMOVAL HARDWARE LEFT FOOT;  Surgeon: Nadara Mustard, MD;  Location: [facility] OR;  Service: Orthopedics;  Laterality: Left;   KNEE ARTHROSCOPY Right    Social History   Occupational History   Not on file  Tobacco Use   Smoking status: Never    Passive exposure: Past   Smokeless tobacco: Never  Vaping Use   Vaping status: Not on file  Substance and Sexual Activity   Alcohol use: Yes    Comment: occasoinally   Drug use: Never   Sexual activity: Yes

## 2023-08-30 ENCOUNTER — Ambulatory Visit
Admission: RE | Admit: 2023-08-30 | Discharge: 2023-08-30 | Disposition: A | Discharge status: Home / Self Care | Payer: PRIVATE HEALTH INSURANCE | Source: Ambulatory Visit | Attending: Orthopedic Surgery | Admitting: Orthopedic Surgery

## 2023-08-30 DIAGNOSIS — G8929 Other chronic pain: Secondary | ICD-10-CM

## 2023-09-03 IMAGING — CT CT ANKLE*L* W/O CM
1 series · 12 of 14 positions shown, 15 images · non-contrast
Comparison: Plain films left ankle 03/02/2022.

CLINICAL DATA: Chronic left ankle and hindfoot pain. History of
prior left ankle surgery. No recent injury.

EXAM:
CT OF THE LEFT ANKLE WITHOUT CONTRAST
TECHNIQUE: Multidetector CT imaging of the left ankle was performed according
to the standard protocol. Multiplanar CT image reconstructions were
also generated.
RADIATION DOSE REDUCTION: This exam was performed according to the
departmental dose-optimization program which includes automated
exposure control, adjustment of the mA and/or kV according to
patient size and/or use of iterative reconstruction technique.

[Series 4: soft tissue lower extremity (person_name) · axial · 0.33mm/px · z∈[-243,-79]mm · 12 of 98 slices shown, 15 images]
[im 8/98  soft-tissue]
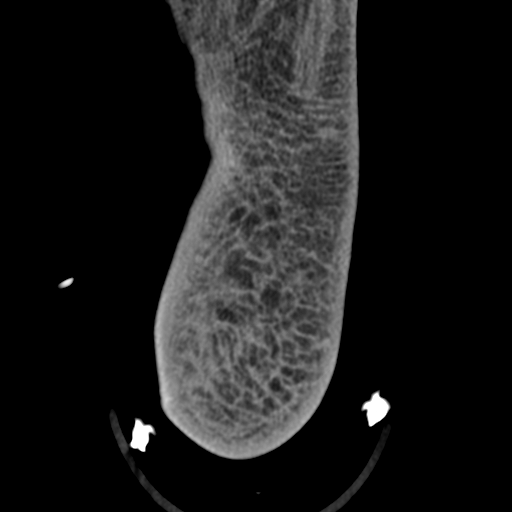
[im 8/98  bone]
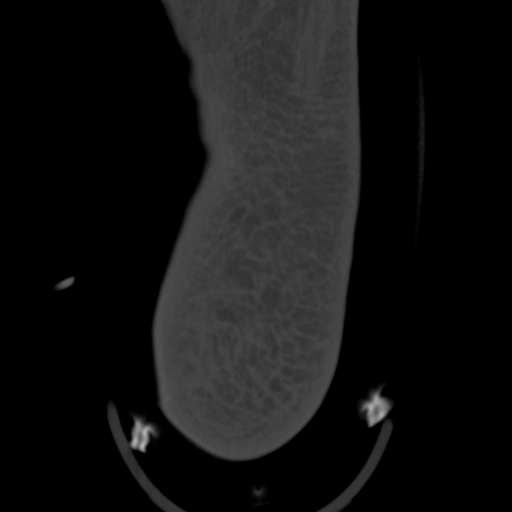
[im 15/98  bone]
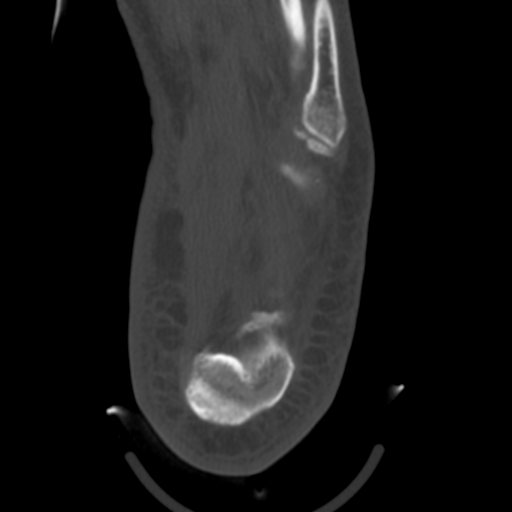
[im 23/98  bone]
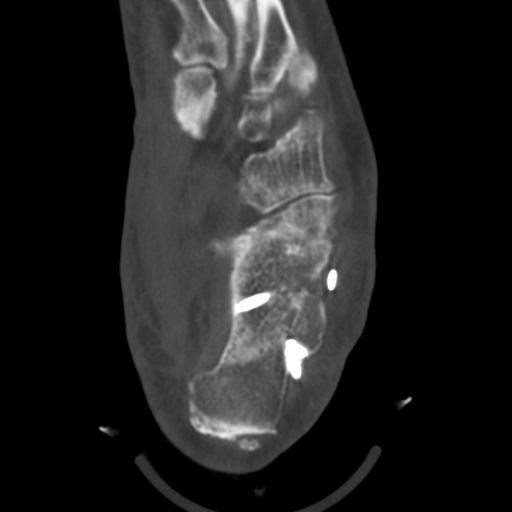
[im 30/98  bone]
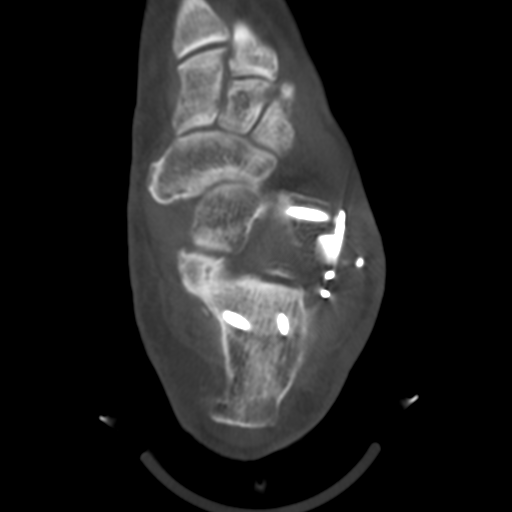
[im 38/98  soft-tissue]
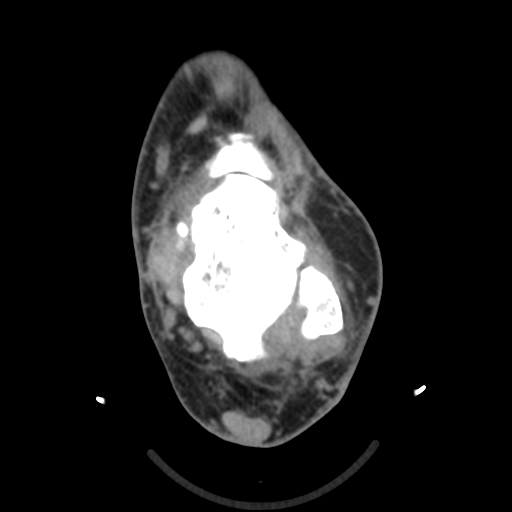
[im 38/98  bone]
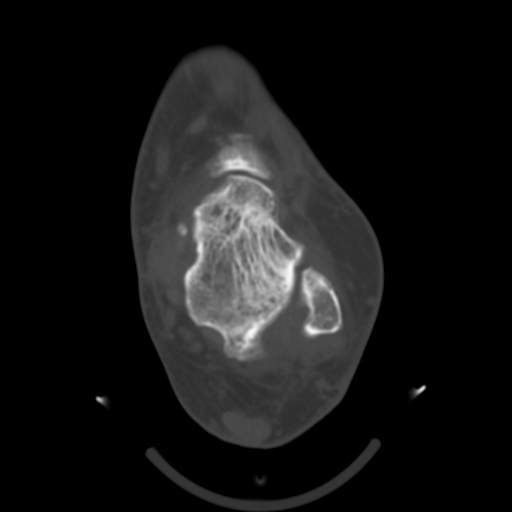
[im 45/98  bone]
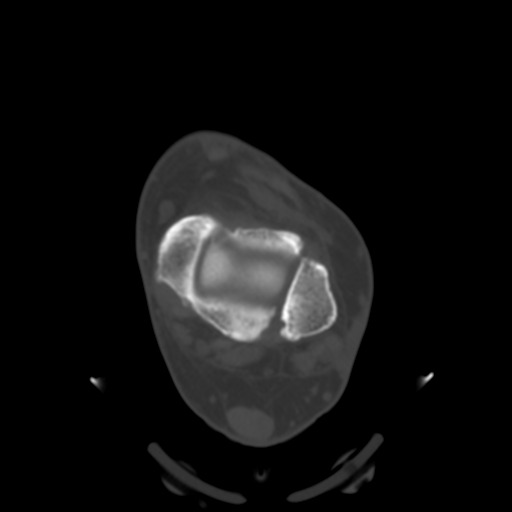
[im 53/98  bone]
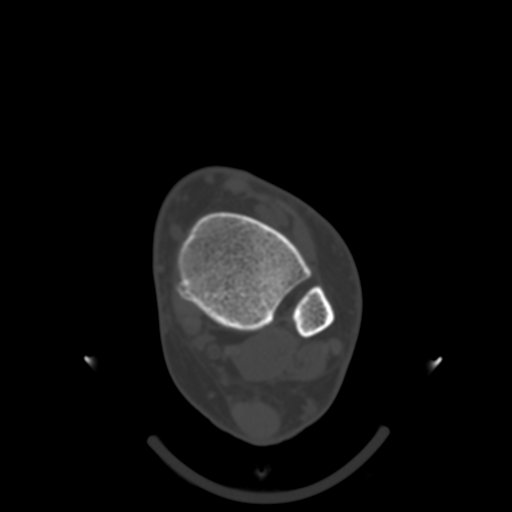
[im 60/98  bone]
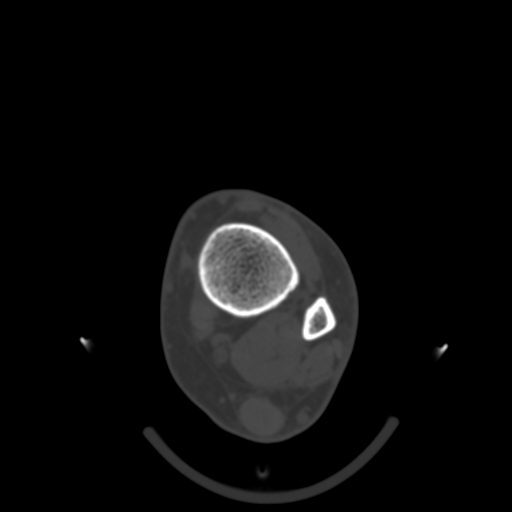
[im 68/98  soft-tissue]
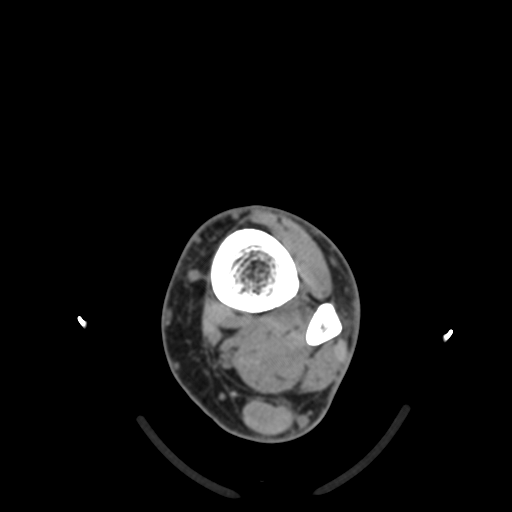
[im 68/98  bone]
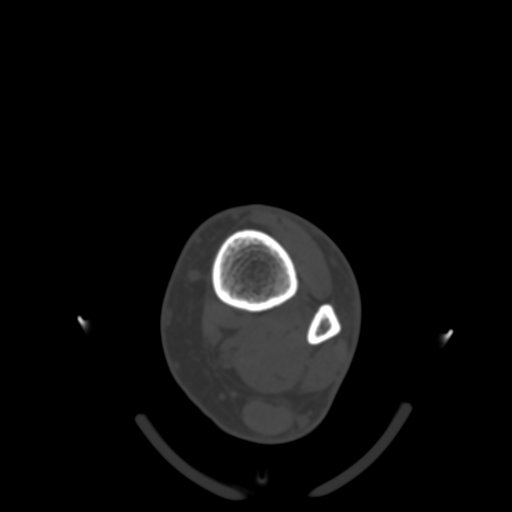
[im 75/98  bone]
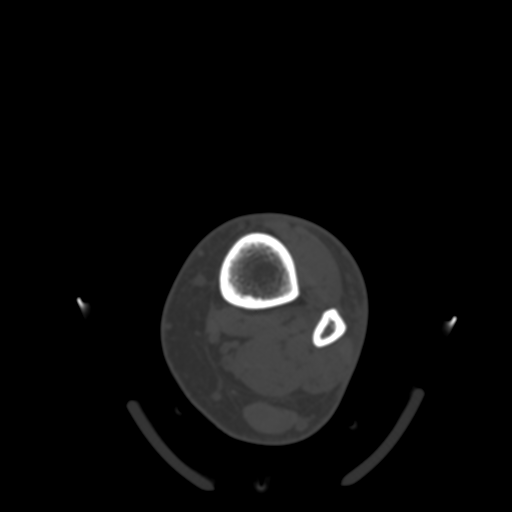
[im 83/98  bone]
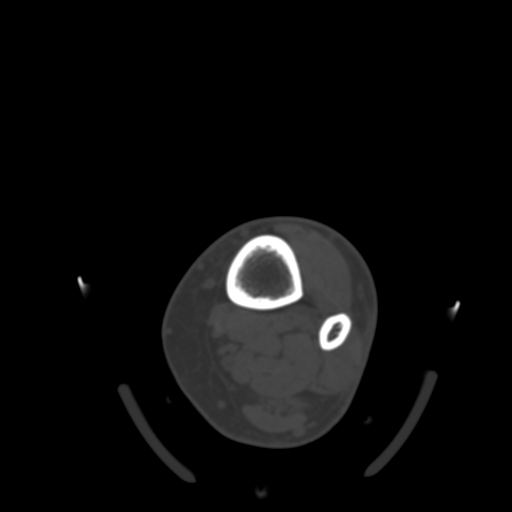
[im 90/98  bone]
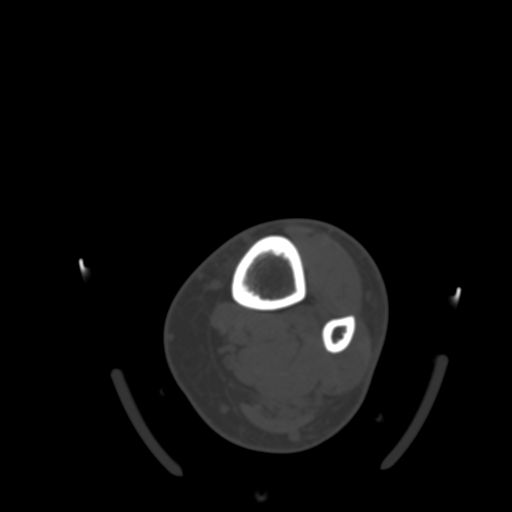

[12 of 14 positions shown; findings below may reference images not displayed]

FINDINGS: Bones/Joint/Cartilage

Lateral plate and screws are in place in the calcaneus. As seen on
the prior plain films, there is a loose screw in the lateral
subcutaneous tissues just proximal to the calcaneocuboid joint.
Hardware is otherwise intact and appears to have been placed for
osteotomy of the calcaneus proximal to the calcaneocuboid joint. The
osteotomy is solidly healed.

No acute bony or joint abnormality is seen. There is some joint
space narrowing and osteophytosis of the calcaneocuboid joint. No
osteochondral lesion of the talar dome is identified. There is a
small plantar calcaneal spur.

Ligaments

Suboptimally assessed by CT.

Muscles and Tendons

As visualized by CT, muscles and tendons appear intact. The Achilles
tendon appears mildly thickened approximately 3.3 cm superior to its
insertion on the calcaneus.

Soft tissues

No fluid collection or mass identified.
IMPRESSION: Surgical hardware in place the calcaneus apparently for prior
osteotomy. Hardware is intact and osteotomy of the calcaneus
proximal to the calcaneocuboid joint is healed. Loose screw in the
lateral subcutaneous tissues as seen on prior plain films is noted.

Findings compatible with noninsertional Achilles tendinosis.

Mild appearing calcaneocuboid osteoarthritis.

## 2023-09-20 ENCOUNTER — Ambulatory Visit (INDEPENDENT_AMBULATORY_CARE_PROVIDER_SITE_OTHER): Payer: PRIVATE HEALTH INSURANCE | Admitting: Orthopedic Surgery

## 2023-09-20 DIAGNOSIS — G8929 Other chronic pain: Secondary | ICD-10-CM

## 2023-09-24 ENCOUNTER — Ambulatory Visit (INDEPENDENT_AMBULATORY_CARE_PROVIDER_SITE_OTHER): Payer: PRIVATE HEALTH INSURANCE | Admitting: Orthopedic Surgery

## 2023-09-24 ENCOUNTER — Encounter: Payer: Self-pay | Admitting: Orthopedic Surgery

## 2023-09-24 ENCOUNTER — Other Ambulatory Visit: Payer: Self-pay

## 2023-09-24 DIAGNOSIS — M25512 Pain in left shoulder: Secondary | ICD-10-CM

## 2023-09-24 DIAGNOSIS — G8929 Other chronic pain: Secondary | ICD-10-CM

## 2023-09-24 DIAGNOSIS — M65912 Unspecified synovitis and tenosynovitis, left shoulder: Secondary | ICD-10-CM

## 2023-09-24 MED ORDER — METHYLPREDNISOLONE ACETATE 40 MG/ML IJ SUSP
40.0000 mg | INTRAMUSCULAR | Status: AC | PRN
Start: 2023-09-24 — End: 2023-09-24
  Administered 2023-09-24: 40 mg via INTRA_ARTICULAR

## 2023-09-24 MED ORDER — LIDOCAINE HCL 1 % IJ SOLN
5.0000 mL | INTRAMUSCULAR | Status: AC | PRN
Start: 2023-09-24 — End: 2023-09-24
  Administered 2023-09-24: 5 mL

## 2023-09-24 NOTE — Progress Notes (Unsigned)
Office Visit Note   Patient: Stephanie Mccall           Date of Birth: 1971-04-28           MRN: [id number] Visit Date: 09/24/2023 Requested by: Jeani Sow, MD [address] [place],  [place] [address] PCP: Jeani Sow, MD  Subjective: Chief Complaint  Patient presents with   Left Shoulder - Pain    HPI: Chelce Notch is a 52 y.o. female who presents to the office reporting left shoulder pain.  Pain has been going on since January 2024.  Denies any history of injury.  Symptoms are getting worse.  Does wake her from sleep at night.  She has had prior subacromial injection without much relief.  Does report some decreased range of motion.  She is right-hand dominant.  Takes Tylenol and naproxen for symptoms.  She works as a Lawyer.  She is doing well with her foot wound.  No increase in blood glucose from prior cortisone injection.  MRI scan from 08/30/2023 demonstrates severe tendinosis of the supraspinatus tendon with partial thickness tear along with tendinosis of the infraspinatus tendon and some marrow edema in the lesser tuberosity consistent with possible subcoracoid impingement.  There is also significant tendinosis of the biceps tendon as well as arthritis in the left glenohumeral joint..                ROS: All systems reviewed are negative as they relate to the chief complaint within the history of present illness.  Patient denies fevers or chills.  Assessment & Plan: Visit Diagnoses:  1. Chronic left shoulder pain     Plan: Impression is left shoulder pain with a lot of potential pain generators in the shoulder.  Biceps tendinitis and glenohumeral joint arthritis are likely the most probable pain generators.  Intra-articular injection performed today.  Follow-up in 2 months for clinical recheck.  She really wants to avoid any type of surgical intervention.  Conceivably biceps tenodesis could be considered but I do not think that would be universally helpful for all of  her pain.  Follow-Up Instructions: No follow-ups on file.   Orders:  Orders Placed This Encounter  Procedures   US Guided Needle Placement - No Linked Charges   No orders of the defined types were placed in this encounter.     Procedures: Large Joint Inj: L glenohumeral on 09/24/2023 9:39 PM Indications: diagnostic evaluation and pain Details: 22 G 1.5 in needle, ultrasound-guided posterior approach  Arthrogram: No  Medications: 9 mL bupivacaine 0.5 %; 40 mg methylPREDNISolone acetate 40 MG/ML; 5 mL lidocaine 1 % Outcome: tolerated well, no immediate complications Procedure, treatment alternatives, risks and benefits explained, specific risks discussed. Consent was given by the patient. Immediately prior to procedure a time out was called to verify the correct patient, procedure, equipment, support staff and site/side marked as required. Patient was prepped and draped in the usual sterile fashion.       Clinical Data: No additional findings.  Objective: Vital Signs: There were no vitals taken for this visit.  Physical Exam:  Constitutional: Patient appears well-developed HEENT:  Head: Normocephalic Eyes:EOM are normal Neck: Normal range of motion Cardiovascular: Normal rate Pulmonary/chest: Effort normal Neurologic: Patient is alert Skin: Skin is warm Psychiatric: Patient has normal mood and affect  Ortho Exam: Ortho exam demonstrates painful passive and active range of motion of the left shoulder but her rotator cuff strength is intact and symmetric to infraspinatus supraspinatus and  subscap muscle testing.  No discrete AC joint tenderness to direct palpation on the left compared to the right.  No masses lymphadenopathy or skin changes noted in that shoulder girdle region.  Does have some pain with crossarm adduction as well as some pain with internal and external rotation of the arm at 90 degrees of abduction.  O'Brien's testing positive on the left negative on the  right.  Passive range of motion is approximately 45/90/150 on the left 170 on the right but that motion is painful overhead. Specialty Comments:  No specialty comments available.  Imaging: US Guided Needle Placement - No Linked Charges  Result Date: 09/24/2023 Ultrasound imaging demonstrates needle placement into the left glenohumeral joint with extravasation of fluid into the joint and no complicating features    PMFS History: Patient Active Problem List   Diagnosis Date Noted   Pain from implanted hardware 08/03/2023   Calcaneal spur of foot, left 08/03/2023   Tendonitis of ankle, left 08/03/2023   Controlled type 2 diabetes mellitus without complication, without long-term current use of insulin (HCC) 09/29/2021   Hypertension associated with diabetes (HCC) 09/29/2021   Hyperlipidemia associated with type 2 diabetes mellitus (HCC) 09/29/2021   Morbid obesity (HCC) 09/29/2021   Past Medical History:  Diagnosis Date   Allergy    SEASONAL   Arthritis    SHOULDERS   Diabetes mellitus without complication (HCC)    Dysrhythmia    Hyperlipidemia    Hypertension    Sleep apnea     Family History  Problem Relation Age of Onset   Lung cancer Maternal Aunt    Lung cancer Maternal Uncle    Breast cancer Maternal Grandmother    Colon cancer Neg Hx    Colon polyps Neg Hx    Crohn's disease Neg Hx    Esophageal cancer Neg Hx    Rectal cancer Neg Hx    Stomach cancer Neg Hx    Ulcerative colitis Neg Hx     Past Surgical History:  Procedure Laterality Date   ABDOMINAL HYSTERECTOMY     partial. fibroids   ANKLE SURGERY Left    screws to straighten   CHOLECYSTECTOMY     HARDWARE REMOVAL Left 08/03/2023   Procedure: REMOVAL HARDWARE LEFT FOOT;  Surgeon: Nadara Mustard, MD;  Location: [facility] OR;  Service: Orthopedics;  Laterality: Left;   KNEE ARTHROSCOPY Right    Social History   Occupational History   Not on file  Tobacco Use   Smoking status: Never    Passive exposure: Past    Smokeless tobacco: Never  Vaping Use   Vaping status: Not on file  Substance and Sexual Activity   Alcohol use: Yes    Comment: occasoinally   Drug use: Never   Sexual activity: Yes

## 2023-09-24 NOTE — Progress Notes (Signed)
Office Visit Note   Patient: Stephanie Mccall           Date of Birth: November 28, 1970           MRN: [id number] Visit Date: 09/20/2023              Requested by: Jeani Sow, MD [address] [place],  [place] [address] PCP: Jeani Sow, MD  Chief Complaint  Patient presents with   Left Shoulder - Follow-up      HPI: Patient is a 52 year old woman who is seen in follow-up for 2 separate issues.  #1 she is status post MRI scan of the left shoulder for chronic left shoulder pain and status post removal deep retained hardware left ankle currently in an ASO currently unable to weight-bear sufficiently to return to work.  Patient states the last subacromial injection for her left shoulder did not provide any relief.  Assessment & Plan: Visit Diagnoses:  1. Chronic left shoulder pain     Plan: Patient underwent a subacromial injection for her left shoulder today.  We will have her follow-up with Dr. August Saucer for evaluation for possible surgical intervention for the left shoulder.  At this time patient states she is not interested in surgery.  Patient is still having difficulty with weightbearing on the left heel status post removal of deep retained hardware.  She was initially provided a note to return to work on December 2 and I think this is still reasonable.  She is unable to weight-bear on the foot to return to work at this time.  Follow-Up Instructions: No follow-ups on file.   Ortho Exam  Patient is alert, oriented, no adenopathy, well-dressed, normal affect, normal respiratory effort. Examination patient's left shoulder she has pain with Neer and Hawkins impingement test pain with abduction and flexion.  Review of the MRI scan shows tendinopathy with severe osteoarthritis of the glenohumeral joint.  Examination of the left foot the incision is healing there is no signs of infection patient has pain to palpation pain with weightbearing.  Imaging: No results found. No images  are attached to the encounter.  Labs: Lab Results  Component Value Date   HGBA1C 7.4 (H) 07/03/2023   HGBA1C 7.0 (H) 01/09/2023   HGBA1C 8.4 (H) 06/28/2022     Lab Results  Component Value Date   ALBUMIN 4.6 07/03/2023   ALBUMIN 4.5 01/09/2023   ALBUMIN 4.1 06/28/2022    No results found for: "MG" Lab Results  Component Value Date   VD25OH 31.72 10/14/2021    No results found for: "PREALBUMIN"    Latest Ref Rng & Units 07/03/2023    3:53 PM 01/09/2023   10:14 AM 04/05/2022    9:35 AM  CBC EXTENDED  WBC 4.0 - 10.5 K/uL 6.4  3.8  4.6   RBC 3.87 - 5.11 Mil/uL 4.87  5.32  5.18   Hemoglobin 12.0 - 15.0 g/dL 13.2  44.0  10.2   HCT 36.0 - 46.0 % 41.7  45.5  45.2   Platelets 150.0 - 400.0 K/uL 311.0  284.0  259   NEUT# 1.4 - 7.7 K/uL 3.7     Lymph# 0.7 - 4.0 K/uL 2.2        There is no height or weight on file to calculate BMI.  Orders:  No orders of the defined types were placed in this encounter.  No orders of the defined types were placed in this encounter.    Procedures: Large Joint Inj:  L subacromial bursa on 09/24/2023 5:32 PM Indications: diagnostic evaluation and pain Details: 22 G 1.5 in needle, posterior approach  Arthrogram: No  Medications: 5 mL lidocaine 1 %; 40 mg methylPREDNISolone acetate 40 MG/ML Outcome: tolerated well, no immediate complications Procedure, treatment alternatives, risks and benefits explained, specific risks discussed. Consent was given by the patient. Immediately prior to procedure a time out was called to verify the correct patient, procedure, equipment, support staff and site/side marked as required. Patient was prepped and draped in the usual sterile fashion.      Clinical Data: No additional findings.  ROS:  All other systems negative, except as noted in the HPI. Review of Systems  Objective: Vital Signs: There were no vitals taken for this visit.  Specialty Comments:  No specialty comments available.  PMFS  History: Patient Active Problem List   Diagnosis Date Noted   Pain from implanted hardware 08/03/2023   Calcaneal spur of foot, left 08/03/2023   Tendonitis of ankle, left 08/03/2023   Controlled type 2 diabetes mellitus without complication, without long-term current use of insulin (HCC) 09/29/2021   Hypertension associated with diabetes (HCC) 09/29/2021   Hyperlipidemia associated with type 2 diabetes mellitus (HCC) 09/29/2021   Morbid obesity (HCC) 09/29/2021   Past Medical History:  Diagnosis Date   Allergy    SEASONAL   Arthritis    SHOULDERS   Diabetes mellitus without complication (HCC)    Dysrhythmia    Hyperlipidemia    Hypertension    Sleep apnea     Family History  Problem Relation Age of Onset   Lung cancer Maternal Aunt    Lung cancer Maternal Uncle    Breast cancer Maternal Grandmother    Colon cancer Neg Hx    Colon polyps Neg Hx    Crohn's disease Neg Hx    Esophageal cancer Neg Hx    Rectal cancer Neg Hx    Stomach cancer Neg Hx    Ulcerative colitis Neg Hx     Past Surgical History:  Procedure Laterality Date   ABDOMINAL HYSTERECTOMY     partial. fibroids   ANKLE SURGERY Left    screws to straighten   CHOLECYSTECTOMY     HARDWARE REMOVAL Left 08/03/2023   Procedure: REMOVAL HARDWARE LEFT FOOT;  Surgeon: Nadara Mustard, MD;  Location: [facility] OR;  Service: Orthopedics;  Laterality: Left;   KNEE ARTHROSCOPY Right    Social History   Occupational History   Not on file  Tobacco Use   Smoking status: Never    Passive exposure: Past   Smokeless tobacco: Never  Vaping Use   Vaping status: Not on file  Substance and Sexual Activity   Alcohol use: Yes    Comment: occasoinally   Drug use: Never   Sexual activity: Yes

## 2023-09-25 MED ORDER — METHYLPREDNISOLONE ACETATE 40 MG/ML IJ SUSP
40.0000 mg | INTRAMUSCULAR | Status: AC | PRN
Start: 2023-09-24 — End: 2023-09-24
  Administered 2023-09-24: 40 mg via INTRA_ARTICULAR

## 2023-09-25 MED ORDER — LIDOCAINE HCL 1 % IJ SOLN
5.0000 mL | INTRAMUSCULAR | Status: AC | PRN
Start: 2023-09-24 — End: 2023-09-24
  Administered 2023-09-24: 5 mL

## 2023-09-25 MED ORDER — BUPIVACAINE HCL 0.5 % IJ SOLN
9.0000 mL | INTRAMUSCULAR | Status: AC | PRN
Start: 2023-09-24 — End: 2023-09-24
  Administered 2023-09-24: 9 mL via INTRA_ARTICULAR

## 2023-09-26 ENCOUNTER — Telehealth: Payer: Self-pay

## 2023-09-26 NOTE — Telephone Encounter (Signed)
Pls make sure f/u is scheduled.

## 2023-09-26 NOTE — Telephone Encounter (Signed)
-----   Message from Burnard Bunting sent at 09/25/2023  7:01 PM EST ----- Hi Jaysion Ramseyer can you have her follow-up in 2 months.  Thanks

## 2023-10-04 ENCOUNTER — Other Ambulatory Visit: Payer: Self-pay | Admitting: Family Medicine

## 2023-10-04 ENCOUNTER — Other Ambulatory Visit: Payer: Self-pay

## 2023-10-04 ENCOUNTER — Other Ambulatory Visit (HOSPITAL_COMMUNITY): Payer: Self-pay

## 2023-10-04 MED ORDER — POTASSIUM CHLORIDE CRYS ER 10 MEQ PO TBCR
10.0000 meq | EXTENDED_RELEASE_TABLET | Freq: Two times a day (BID) | ORAL | 1 refills | Status: AC
Start: 2023-10-04 — End: ?
  Filled 2023-10-04: qty 60, 30d supply, fill #0
  Filled 2023-11-04: qty 60, 30d supply, fill #1
  Filled 2023-12-31: qty 60, 30d supply, fill #2

## 2023-10-23 ENCOUNTER — Encounter: Payer: Self-pay | Admitting: Orthopedic Surgery

## 2023-10-24 ENCOUNTER — Other Ambulatory Visit: Payer: Self-pay | Admitting: Surgical

## 2023-10-24 ENCOUNTER — Other Ambulatory Visit (HOSPITAL_COMMUNITY): Payer: Self-pay

## 2023-10-24 MED ORDER — DICLOFENAC SODIUM 75 MG PO TBEC
75.0000 mg | DELAYED_RELEASE_TABLET | Freq: Two times a day (BID) | ORAL | 2 refills | Status: DC
  Filled 2023-10-24: qty 60, 30d supply, fill #0
  Filled 2023-11-19: qty 60, 30d supply, fill #1
  Filled 2023-12-31: qty 60, 30d supply, fill #2

## 2023-10-24 NOTE — Telephone Encounter (Signed)
Sent in med

## 2023-11-28 ENCOUNTER — Encounter: Payer: Self-pay | Admitting: Orthopedic Surgery

## 2023-11-28 ENCOUNTER — Ambulatory Visit: Payer: PRIVATE HEALTH INSURANCE | Admitting: Orthopedic Surgery

## 2023-11-28 ENCOUNTER — Other Ambulatory Visit (HOSPITAL_COMMUNITY): Payer: Self-pay

## 2023-11-28 ENCOUNTER — Other Ambulatory Visit: Payer: Self-pay

## 2023-11-28 DIAGNOSIS — M25512 Pain in left shoulder: Secondary | ICD-10-CM

## 2023-11-28 DIAGNOSIS — M19012 Primary osteoarthritis, left shoulder: Secondary | ICD-10-CM

## 2023-11-28 DIAGNOSIS — G8929 Other chronic pain: Secondary | ICD-10-CM

## 2023-11-28 MED ORDER — CELECOXIB 100 MG PO CAPS
100.0000 mg | ORAL_CAPSULE | Freq: Two times a day (BID) | ORAL | 0 refills | Status: DC
  Filled 2023-11-28: qty 60, 30d supply, fill #0

## 2023-11-28 NOTE — Progress Notes (Signed)
Office Visit Note   Patient: Stephanie Mccall           Date of Birth: 1971-03-26           MRN: [id number] Visit Date: 11/28/2023 Requested by: Jeani Sow, MD [address] [place],  [place] [address] PCP: Jeani Sow, MD  Subjective: Chief Complaint  Patient presents with   Left Shoulder - Pain, Follow-up    HPI: Stephanie Mccall is a 53 y.o. female who presents to the office reporting left shoulder pain.  Patient had glenohumeral joint injection 09/24/2023 which gave her very good relief for about a month.  Patient has had recurrent pain in the shoulder.  At times it feels worse.  She is able to work but at nighttime the pain is worse.  She works as a Lawyer.  Has some occasional radicular pain down to the elbow.  Takes Tylenol and diclofenac which helps.  She does have a history of reflux.  She cannot really have surgery this year due to prior surgery on her ankle and constraints of time off from work..                ROS: All systems reviewed are negative as they relate to the chief complaint within the history of present illness.  Patient denies fevers or chills.  Assessment & Plan: Visit Diagnoses:  1. 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  I think she is likely heading for shoulder replacement.  We will do a glenohumeral joint injection today and we could repeat that in about 4 to 6 months depending on how much relief she gets.  She is starting to get a little bit less range of motion in the left shoulder compared to the right.  Celebrex prescribed as well because she has some reflux from diclofenac and other anti-inflammatories such as Motrin.  Follow-Up Instructions: No follow-ups on file.   Orders:  Orders Placed This Encounter  Procedures   US Guided Needle Placement - No Linked Charges   Meds ordered this encounter  Medications   celecoxib (CELEBREX) 100 MG capsule    Sig: Take 1 capsule  (100 mg total) by mouth 2 (two) times daily.    Dispense:  60 capsule    Refill:  0      Procedures: Large Joint Inj: L glenohumeral on 11/28/2023 10:30 PM Indications: diagnostic evaluation and pain Details: 22 G 1.5 in and 3.5 in needle, ultrasound-guided posterior approach  Arthrogram: No  Medications: 9 mL bupivacaine 0.5 %; 40 mg methylPREDNISolone acetate 40 MG/ML; 5 mL lidocaine 1 % Outcome: tolerated well, no immediate complications Procedure, treatment alternatives, risks and benefits explained, specific risks discussed. Consent was given by the patient. Immediately prior to procedure a time out was called to verify the correct patient, procedure, equipment, support staff and site/side marked as required. Patient was prepped and draped in the usual sterile fashion.       Clinical Data: No additional findings.  Objective: Vital Signs: There were no vitals taken for this visit.  Physical Exam:  Constitutional: Patient appears well-developed HEENT:  Head: Normocephalic Eyes:EOM are normal Neck: Normal range of motion Cardiovascular: Normal rate Pulmonary/chest: Effort normal Neurologic: Patient is alert Skin: Skin is warm Psychiatric: Patient has normal mood and affect  Ortho Exam: Ortho exam demonstrates range of motion on the left of 30/80/135.  Rotator cuff strength is pretty reasonable to infraspinatus supraspinatus  and subscap muscle testing but supraspinatus is a little bit more painful for her.  No discrete AC joint tenderness is present.  Motor or sensory function to the hand is intact.  No other masses lymphadenopathy or skin changes noted on the left shoulder region.  Specialty Comments:  No specialty comments available.  Imaging: No results found.   PMFS History: Patient Active Problem List   Diagnosis Date Noted   Pain from implanted hardware 08/03/2023   Calcaneal spur of foot, left 08/03/2023   Tendonitis of ankle, left 08/03/2023   Controlled  type 2 diabetes mellitus without complication, without long-term current use of insulin (HCC) 09/29/2021   Hypertension associated with diabetes (HCC) 09/29/2021   Hyperlipidemia associated with type 2 diabetes mellitus (HCC) 09/29/2021   Morbid obesity (HCC) 09/29/2021   Past Medical History:  Diagnosis Date   Allergy    SEASONAL   Arthritis    SHOULDERS   Diabetes mellitus without complication (HCC)    Dysrhythmia    Hyperlipidemia    Hypertension    Sleep apnea     Family History  Problem Relation Age of Onset   Lung cancer Maternal Aunt    Lung cancer Maternal Uncle    Breast cancer Maternal Grandmother    Colon cancer Neg Hx    Colon polyps Neg Hx    Crohn's disease Neg Hx    Esophageal cancer Neg Hx    Rectal cancer Neg Hx    Stomach cancer Neg Hx    Ulcerative colitis Neg Hx     Past Surgical History:  Procedure Laterality Date   ABDOMINAL HYSTERECTOMY     partial. fibroids   ANKLE SURGERY Left    screws to straighten   CHOLECYSTECTOMY     HARDWARE REMOVAL Left 08/03/2023   Procedure: REMOVAL HARDWARE LEFT FOOT;  Surgeon: Nadara Mustard, MD;  Location: [facility] OR;  Service: Orthopedics;  Laterality: Left;   KNEE ARTHROSCOPY Right    Social History   Occupational History   Not on file  Tobacco Use   Smoking status: Never    Passive exposure: Past   Smokeless tobacco: Never  Vaping Use   Vaping status: Not on file  Substance and Sexual Activity   Alcohol use: Yes    Comment: occasoinally   Drug use: Never   Sexual activity: Yes

## 2023-11-29 MED ORDER — METHYLPREDNISOLONE ACETATE 40 MG/ML IJ SUSP
40.0000 mg | INTRAMUSCULAR | Status: AC | PRN
Start: 2023-11-28 — End: 2023-11-28
  Administered 2023-11-28: 40 mg via INTRA_ARTICULAR

## 2023-11-29 MED ORDER — BUPIVACAINE HCL 0.5 % IJ SOLN
9.0000 mL | INTRAMUSCULAR | Status: AC | PRN
Start: 2023-11-28 — End: 2023-11-28
  Administered 2023-11-28: 9 mL via INTRA_ARTICULAR

## 2023-11-29 MED ORDER — LIDOCAINE HCL 1 % IJ SOLN
5.0000 mL | INTRAMUSCULAR | Status: AC | PRN
Start: 2023-11-28 — End: 2023-11-28
  Administered 2023-11-28: 5 mL

## 2023-12-06 ENCOUNTER — Other Ambulatory Visit (HOSPITAL_COMMUNITY): Payer: Self-pay

## 2023-12-10 ENCOUNTER — Other Ambulatory Visit: Payer: Self-pay | Admitting: Family Medicine

## 2023-12-10 DIAGNOSIS — E119 Type 2 diabetes mellitus without complications: Secondary | ICD-10-CM

## 2023-12-24 ENCOUNTER — Other Ambulatory Visit (HOSPITAL_COMMUNITY): Payer: Self-pay

## 2023-12-26 ENCOUNTER — Other Ambulatory Visit (HOSPITAL_COMMUNITY): Payer: Self-pay

## 2024-01-04 ENCOUNTER — Encounter: Payer: Self-pay | Admitting: Family Medicine

## 2024-01-04 ENCOUNTER — Other Ambulatory Visit (HOSPITAL_COMMUNITY): Payer: Self-pay

## 2024-01-04 ENCOUNTER — Ambulatory Visit: Payer: Self-pay | Admitting: Family Medicine

## 2024-01-04 VITALS — BP 146/88 | HR 69 | Temp 98.0°F | Resp 18 | Ht 66.0 in | Wt 264.2 lb

## 2024-01-04 DIAGNOSIS — Z7984 Long term (current) use of oral hypoglycemic drugs: Secondary | ICD-10-CM

## 2024-01-04 DIAGNOSIS — Z7985 Long-term (current) use of injectable non-insulin antidiabetic drugs: Secondary | ICD-10-CM

## 2024-01-04 DIAGNOSIS — I152 Hypertension secondary to endocrine disorders: Secondary | ICD-10-CM

## 2024-01-04 DIAGNOSIS — E1159 Type 2 diabetes mellitus with other circulatory complications: Secondary | ICD-10-CM

## 2024-01-04 DIAGNOSIS — E119 Type 2 diabetes mellitus without complications: Secondary | ICD-10-CM

## 2024-01-04 DIAGNOSIS — E1169 Type 2 diabetes mellitus with other specified complication: Secondary | ICD-10-CM

## 2024-01-04 DIAGNOSIS — R1030 Lower abdominal pain, unspecified: Secondary | ICD-10-CM

## 2024-01-04 DIAGNOSIS — E785 Hyperlipidemia, unspecified: Secondary | ICD-10-CM

## 2024-01-04 LAB — POC URINALSYSI DIPSTICK (AUTOMATED)
Bilirubin, UA: NEGATIVE
Blood, UA: POSITIVE
Glucose, UA: NEGATIVE
Ketones, UA: NEGATIVE
Leukocytes, UA: NEGATIVE
Nitrite, UA: NEGATIVE
Protein, UA: NEGATIVE
Spec Grav, UA: 1.025 (ref 1.010–1.025)
Urobilinogen, UA: 0.2 U/dL
pH, UA: 6 (ref 5.0–8.0)

## 2024-01-04 MED ORDER — GLIPIZIDE ER 2.5 MG PO TB24
2.5000 mg | ORAL_TABLET | Freq: Every day | ORAL | 1 refills | Status: DC
  Filled 2024-01-04: qty 30, 30d supply, fill #0
  Filled 2024-01-30: qty 30, 30d supply, fill #1
  Filled 2024-03-02: qty 30, 30d supply, fill #2

## 2024-01-04 MED ORDER — TIRZEPATIDE 7.5 MG/0.5ML ~~LOC~~ SOAJ
7.5000 mg | SUBCUTANEOUS | 1 refills | Status: DC
Start: 2024-01-04 — End: 2024-04-02
  Filled 2024-01-04 – 2024-01-24 (×2): qty 2, 28d supply, fill #0
  Filled 2024-02-20: qty 2, 28d supply, fill #1

## 2024-01-04 MED ORDER — MICROLET LANCETS MISC
Freq: Two times a day (BID) | 1 refills | Status: AC
  Filled 2024-01-04: qty 100, 30d supply, fill #0
  Filled 2024-01-30: qty 100, 30d supply, fill #1

## 2024-01-04 MED ORDER — ONETOUCH VERIO VI STRP
ORAL_STRIP | 3 refills | Status: DC
Start: 2024-01-04 — End: 2024-05-05
  Filled 2024-01-04: qty 50, 25d supply, fill #0
  Filled 2024-01-24: qty 50, 25d supply, fill #1
  Filled 2024-02-20: qty 50, 25d supply, fill #2
  Filled 2024-03-23: qty 50, 25d supply, fill #3

## 2024-01-04 MED ORDER — KETOCONAZOLE 2 % EX CREA
1.0000 | TOPICAL_CREAM | Freq: Every day | CUTANEOUS | 1 refills | Status: DC | PRN
  Filled 2024-01-04: qty 15, 15d supply, fill #0

## 2024-01-04 MED ORDER — POTASSIUM CHLORIDE CRYS ER 10 MEQ PO TBCR
10.0000 meq | EXTENDED_RELEASE_TABLET | Freq: Two times a day (BID) | ORAL | 1 refills | Status: DC
  Filled 2024-01-04: qty 60, 30d supply, fill #0
  Filled 2024-01-30: qty 60, 30d supply, fill #1
  Filled 2024-03-02: qty 60, 30d supply, fill #2

## 2024-01-04 MED ORDER — LOSARTAN POTASSIUM-HCTZ 100-25 MG PO TABS
1.0000 | ORAL_TABLET | Freq: Every day | ORAL | 0 refills | Status: DC
Start: 2024-01-04 — End: 2024-04-02
  Filled 2024-01-04 – 2024-01-28 (×3): qty 30, 30d supply, fill #0
  Filled 2024-02-25: qty 30, 30d supply, fill #1
  Filled 2024-03-23: qty 30, 30d supply, fill #2

## 2024-01-04 MED ORDER — CONTOUR NEXT ONE DEVI
3 refills | Status: AC
  Filled 2024-01-04: qty 1, 30d supply, fill #0
  Filled 2024-01-30 – 2024-02-01 (×2): qty 1, 30d supply, fill #1
  Filled 2024-03-02 – 2024-03-10 (×2): qty 1, 30d supply, fill #2
  Filled 2024-04-08 – 2024-05-03 (×2): qty 1, 30d supply, fill #3

## 2024-01-04 MED ORDER — PRAVASTATIN SODIUM 80 MG PO TABS
80.0000 mg | ORAL_TABLET | Freq: Every day | ORAL | 3 refills | Status: DC
  Filled 2024-01-04: qty 30, 30d supply, fill #0

## 2024-01-04 MED ORDER — METFORMIN HCL 1000 MG PO TABS
1000.0000 mg | ORAL_TABLET | Freq: Two times a day (BID) | ORAL | 0 refills | Status: DC
  Filled 2024-01-04 – 2024-01-28 (×3): qty 60, 30d supply, fill #0
  Filled 2024-02-25 – 2024-03-10 (×4): qty 60, 30d supply, fill #1

## 2024-01-04 MED ORDER — AMLODIPINE BESYLATE 10 MG PO TABS
10.0000 mg | ORAL_TABLET | Freq: Every day | ORAL | 3 refills | Status: AC
  Filled 2024-01-04: qty 30, 30d supply, fill #0
  Filled 2024-01-30: qty 30, 30d supply, fill #1
  Filled 2024-03-02: qty 30, 30d supply, fill #2
  Filled 2024-04-08: qty 30, 30d supply, fill #3
  Filled 2024-05-19: qty 30, 30d supply, fill #4
  Filled 2024-06-13: qty 30, 30d supply, fill #5
  Filled 2024-07-15: qty 30, 30d supply, fill #6
  Filled 2024-08-17: qty 30, 30d supply, fill #7
  Filled 2024-09-14: qty 30, 30d supply, fill #8
  Filled 2024-10-14: qty 30, 30d supply, fill #9
  Filled 2024-11-14: qty 30, 30d supply, fill #10
  Filled 2024-12-12 (×2): qty 30, 30d supply, fill #11

## 2024-01-04 NOTE — Progress Notes (Signed)
 Subjective:     Patient ID: Stephanie Mccall, female    DOB: 1971-10-16, 53 y.o.   MRN: [id number]  Chief Complaint  Patient presents with   Medical Management of Chronic Issues    6 month follow-up on htn, cholesterol, dm    Abdominal Pain    Started 2 weeks ago    HPI HTN - Pt is on Amlodipine 10 mg and Losartan 100-25 mg daily.  Bp's running 150-189/90's-a lot of L shoulder pain.  No ha/dizziness/cp/palp/edema/cough/sob  DM - States she used to check her sugars every day, and has now switched to weekly. Is usually running in the 120s-130s. Is compliant with Metformin 1000 mg BID, Ozempic 2 mg, and Glipizide 2.5 mg daily. Denies exercising. Attempts to eat healthy. Has not lost any more wt  HLD -  Pt is compliant with Pravastatin 80 mg daily.   Lower ab pain . Discussed the use of AI scribe software for clinical note transcription with the patient, who gave verbal consent to proceed.  History of Present Illness   Stephanie Mccall is a 53 year old female with hypertension and diabetes who presents with uncontrolled blood pressure and shoulder pain.  Over the past week, she has experienced elevated blood pressure with readings such as 189/152 and 150/90, which she attributes to significant left shoulder pain. Her blood pressure was well-controlled prior to the onset of her shoulder pain. She experiences 'funny headaches' resembling sinus headaches, which began three weeks ago and occur intermittently, often associated with high blood pressure readings.  She describes severe left shoulder pain that disrupts her sleep, particularly at night. She has been diagnosed with a rotator cuff tear and arthritis, which causes severe pain and limits her mobility. She sometimes has to support her arm due to the pain and experiences spasms that extend to her neck. She is unable to undergo surgery this year due to recent ankle surgery and work constraints.  seeing ortho and getting injections and on  meds.  She describes persistent lower abdominal pain over the past week and a half, which she characterizes as a 'nagging' pain that was severe yesterday but has since eased. She reports increased frequency of urination and a slight itch with an odor, but no significant vaginal discharge. She experienced loose stools three times yesterday, which resolved after taking an anti-diarrheal medication. No fever.  She is currently taking amlodipine 10 mg and losartan 100/25 mg for hypertension, and metformin 1000 mg twice daily, Ozempic 2 mg, and glipizide 2.5 mg for diabetes. She mentions that she is not currently exercising due to pain but plans to resume walking when the weather improves.  She underwent ankle surgery in September to insert screws for alignment. She has a history of a hysterectomy due to fibroids, but retains her ovaries. She is currently using Nizoral cream for her face and has a prescription for Nizoral shampoo.        Health Maintenance Due  Topic Date Due   OPHTHALMOLOGY EXAM  07/29/2023   FOOT EXAM  01/03/2024    Past Medical History:  Diagnosis Date   Allergy    SEASONAL   Arthritis    SHOULDERS   Diabetes mellitus without complication (HCC)    Dysrhythmia    Hyperlipidemia    Hypertension    Sleep apnea     Past Surgical History:  Procedure Laterality Date   ABDOMINAL HYSTERECTOMY     partial. fibroids   ANKLE SURGERY Left    screws  to straighten   CHOLECYSTECTOMY     HARDWARE REMOVAL Left 08/03/2023   Procedure: REMOVAL HARDWARE LEFT FOOT;  Surgeon: Nadara Mustard, MD;  Location: [facility] OR;  Service: Orthopedics;  Laterality: Left;   KNEE ARTHROSCOPY Right      Current Outpatient Medications:    Accu-Chek Softclix Lancets lancets, Test up to twice daily as directed in the morning fasting and when hypoglycemic, Disp: 100 each, Rfl: 1   acetaminophen (TYLENOL) 500 MG tablet, Take 1,000 mg by mouth every 6 (six) hours as needed for headache, moderate pain or  mild pain., Disp: , Rfl:    aspirin-acetaminophen-caffeine (EXCEDRIN MIGRAINE) 250-250-65 MG tablet, Take 2 tablets by mouth every 6 (six) hours as needed for headache., Disp: , Rfl:    Blood Glucose Monitoring Suppl (BLOOD GLUCOSE MONITOR SYSTEM) w/Device KIT, Test up to twice daily as directed in the morning fasting and when hypoglycemic, Disp: 1 kit, Rfl: 0   celecoxib (CELEBREX) 100 MG capsule, Take 1 capsule (100 mg total) by mouth 2 (two) times daily., Disp: 60 capsule, Rfl: 0   diclofenac (VOLTAREN) 75 MG EC tablet, Take 1 tablet (75 mg total) by mouth 2 (two) times daily. Do not take with other NSAIDs such as Ibuprofen or Mobic, Disp: 60 tablet, Rfl: 2   ketoconazole (NIZORAL) 2 % shampoo, Apply 1 Application topically 2 (two) times a week. (Patient taking differently: Apply 1 Application topically daily as needed for irritation.), Disp: 120 mL, Rfl: 3   tirzepatide (MOUNJARO) 7.5 MG/0.5ML Pen, Inject 7.5 mg into the skin once a week., Disp: 2 mL, Rfl: 1   triamcinolone cream (KENALOG) 0.1 %, Apply 1 Application topically 2 (two) times daily. For rash, Disp: 30 g, Rfl: 0   amLODipine (NORVASC) 10 MG tablet, Take 1 tablet (10 mg total) by mouth daily., Disp: 90 tablet, Rfl: 3   Blood Glucose Monitoring Suppl (CONTOUR NEXT ONE) DEVI, Use as directed to monitor glucose twice daily., Disp: 1 each, Rfl: 3   glipiZIDE (GLUCOTROL XL) 2.5 MG 24 hr tablet, Take 1 tablet (2.5 mg total) by mouth daily., Disp: 90 tablet, Rfl: 1   glucose blood (ONETOUCH VERIO) test strip, Use to test glucose up to twice daily as directed in the morning and  as needed for hypoglycemia, Disp: 50 strip, Rfl: 3   ketoconazole (NIZORAL) 2 % cream, Apply 1 Application topically daily as needed for irritation on Face/scalp rash, Disp: 15 g, Rfl: 1   losartan-hydrochlorothiazide (HYZAAR) 100-25 MG tablet, Take 1 tablet by mouth daily., Disp: 90 tablet, Rfl: 0   metFORMIN (GLUCOPHAGE) 1000 MG tablet, Take 1 tablet (1,000 mg total)  by mouth 2 (two) times daily., Disp: 180 tablet, Rfl: 0   Microlet Lancets MISC, Use as directed to monitor glucose twice daily., Disp: 100 each, Rfl: 1   potassium chloride (KLOR-CON M) 10 MEQ tablet, Take 1 tablet (10 mEq total) by mouth 2 (two) times daily., Disp: 90 tablet, Rfl: 1   pravastatin (PRAVACHOL) 80 MG tablet, Take 1 tablet (80 mg total) by mouth daily., Disp: 90 tablet, Rfl: 3  Allergies  Allergen Reactions   Nitroglycerin Itching   ROS neg/noncontributory except as noted HPI/below      Objective:     BP (!) 146/88 (BP Location: Left Arm, Patient Position: Sitting, Cuff Size: Large)   Pulse 69   Temp 98 F (36.7 C) (Temporal)   Resp 18   Ht 5\' 6"  (1.676 m)   Wt 264 lb 4 oz (119.9  kg)   SpO2 98%   BMI 42.65 kg/m  Wt Readings from Last 3 Encounters:  01/04/24 264 lb 4 oz (119.9 kg)  08/03/23 256 lb (116.1 kg)  07/03/23 258 lb 8 oz (117.3 kg)    Physical Exam   Gen: WDWN NAD HEENT: NCAT, conjunctiva not injected, sclera nonicteric NECK:  supple, no thyromegaly, no nodes, no carotid bruits CARDIAC: RRR, S1S2+, no murmur. DP 2+B LUNGS: CTAB. No wheezes ABDOMEN:  BS+, soft, NTND, No HSM, no masses EXT:  no edema  MSK: no gross abnormalities.  NEURO: A&O x3.  CN II-XII intact.  PSYCH: normal mood. Good eye contact  UA neg    Assessment & Plan:  Controlled type 2 diabetes mellitus without complication, without long-term current use of insulin (HCC) Assessment & Plan: Chronic.  Controlled.  Continue glipizide ER 2.5 mg daily, metformin at 1000 mg twice daily, change ozempic 2mg  to mounjaro 7.5mg  to see if better control of wt.   Orders: -     Tirzepatide; Inject 7.5 mg into the skin once a week.  Dispense: 2 mL; Refill: 1 -     glipiZIDE ER; Take 1 tablet (2.5 mg total) by mouth daily.  Dispense: 90 tablet; Refill: 1 -     OneTouch Verio; Use to test glucose up to twice daily as directed in the morning and  as needed for hypoglycemia  Dispense: 50 strip;  Refill: 3 -     metFORMIN HCl; Take 1 tablet (1,000 mg total) by mouth 2 (two) times daily.  Dispense: 180 tablet; Refill: 0 -     Comprehensive metabolic panel -     Hemoglobin A1c -     Microalbumin / creatinine urine ratio  Hypertension associated with diabetes (HCC) Assessment & Plan: chronic. Controlled. Cont hyzaar 100/25/amlodipine 10mg    Orders: -     amLODIPine Besylate; Take 1 tablet (10 mg total) by mouth daily.  Dispense: 90 tablet; Refill: 3 -     Losartan Potassium-HCTZ; Take 1 tablet by mouth daily.  Dispense: 90 tablet; Refill: 0 -     CBC with Differential/Platelet  Hyperlipidemia associated with type 2 diabetes mellitus (HCC) Assessment & Plan: Chronic.  Not well controlled.  Pravastatin was increased to 80mg -check labs  Orders: -     Pravastatin Sodium; Take 1 tablet (80 mg total) by mouth daily.  Dispense: 90 tablet; Refill: 3 -     Comprehensive metabolic panel -     Lipid panel  Lower abdominal pain -     POCT Urinalysis Dipstick (Automated) -     CBC with Differential/Platelet -     Comprehensive metabolic panel  Long term current use of oral hypoglycemic drug  Long-term current use of injectable noninsulin antidiabetic medication  Other orders -     Ketoconazole; Apply 1 Application topically daily as needed for irritation on Face/scalp rash  Dispense: 15 g; Refill: 1 -     Potassium Chloride Crys ER; Take 1 tablet (10 mEq total) by mouth 2 (two) times daily.  Dispense: 90 tablet; Refill: 1  Assessment and Plan    Left Shoulder Pain Chronic left shoulder pain from a rotator cuff tear and arthritis severely affects sleep and daily activities. Surgery is considered but deferred due to job constraints. Pain impacts hypertension and overall health, necessitating a balance between job responsibilities and health needs. Continue seeing Dr. Algis Downs for shoulder management and consider timing for potential surgery. Maintain current pain management with acetaminophen  and ibuprofen as  needed.  Hypertension Uncontrolled hypertension with recent readings of 189/152 mmHg is likely exacerbated by pain and stress from shoulder issues. Associated headaches may relate to elevated blood pressure. Managing pain is crucial for blood pressure control. Recheck and monitor blood pressure at home. Continue current medications: amlodipine 10 mg and losartan 100/25 mg.  Type 2 Diabetes Mellitus Type 2 diabetes is managed with metformin 1000 mg BID, Ozempic 2 mg, and glipizide 2.5 mg. Pain and weight gain hinder exercise. Discussed switching to [facility] for better glycemic control, as it targets additional receptors and may offer improved outcomes. Switch from Ozempic to Mounjaro 7.5 mg, ensuring prior authorization. Refill metformin, glipizide, and glucose test strips. Encourage physical activity as pain allows.  Lower Abdominal Pain Persistent lower abdominal pain for the past week and a half is described as annoying and sometimes severe, No fever reported. Differential diagnosis includes urinary tract infection(UA neg) and gastrointestinal issues, may be coming from back as exam normal. Check urine for infection, consider differential diagnosis, and monitor symptoms with follow-up as needed.  General Health Maintenance Routine medication refills and management of chronic conditions are necessary. Refill amlodipine, losartan, metformin, glipizide, potassium, pravastatin, and Nizoral cream. Send prescriptions to [facility] pharmacy.  Follow-up Follow up with the primary care provider as needed. Monitor blood pressure and blood glucose levels at home. Communicate any changes in symptoms or medication side effects.        Return in about 3 months (around 04/02/2024) for chronic follow-up.    Angelena Sole, MD

## 2024-01-04 NOTE — Patient Instructions (Signed)

## 2024-01-05 ENCOUNTER — Encounter: Payer: Self-pay | Admitting: Family Medicine

## 2024-01-05 LAB — MICROALBUMIN / CREATININE URINE RATIO
Creatinine, Urine: 116 mg/dL (ref 20–275)
Microalb Creat Ratio: 9 mg/g{creat} (ref <30)
Microalb, Ur: 1 mg/dL

## 2024-01-05 LAB — COMPREHENSIVE METABOLIC PANEL
AG Ratio: 1.5 (calc) (ref 1.0–2.5)
ALT: 19 U/L (ref 6–29)
AST: 15 U/L (ref 10–35)
Albumin: 4.5 g/dL (ref 3.6–5.1)
Alkaline phosphatase (APISO): 81 U/L (ref 37–153)
BUN: 17 mg/dL (ref 7–25)
CO2: 26 mmol/L (ref 20–32)
Calcium: 10 mg/dL (ref 8.6–10.4)
Chloride: 103 mmol/L (ref 98–110)
Creat: 0.7 mg/dL (ref 0.50–1.03)
Globulin: 3 g/dL (ref 1.9–3.7)
Glucose, Bld: 118 mg/dL — ABNORMAL HIGH (ref 65–99)
Potassium: 3.7 mmol/L (ref 3.5–5.3)
Sodium: 137 mmol/L (ref 135–146)
Total Bilirubin: 0.3 mg/dL (ref 0.2–1.2)
Total Protein: 7.5 g/dL (ref 6.1–8.1)

## 2024-01-05 LAB — CBC WITH DIFFERENTIAL/PLATELET
Absolute Lymphocytes: 2374 {cells}/uL (ref 850–3900)
Absolute Monocytes: 350 {cells}/uL (ref 200–950)
Basophils Absolute: 21 {cells}/uL (ref 0–200)
Basophils Relative: 0.4 %
Eosinophils Absolute: 133 {cells}/uL (ref 15–500)
Eosinophils Relative: 2.5 %
HCT: 39.5 % (ref 35.0–45.0)
Hemoglobin: 13 g/dL (ref 11.7–15.5)
MCH: 27.8 pg (ref 27.0–33.0)
MCHC: 32.9 g/dL (ref 32.0–36.0)
MCV: 84.4 fL (ref 80.0–100.0)
MPV: 10.2 fL (ref 7.5–12.5)
Monocytes Relative: 6.6 %
Neutro Abs: 2422 {cells}/uL (ref 1500–7800)
Neutrophils Relative %: 45.7 %
Platelets: 305 10*3/uL (ref 140–400)
RBC: 4.68 10*6/uL (ref 3.80–5.10)
RDW: 13.4 % (ref 11.0–15.0)
Total Lymphocyte: 44.8 %
WBC: 5.3 10*3/uL (ref 3.8–10.8)

## 2024-01-05 LAB — HEMOGLOBIN A1C
Hgb A1c MFr Bld: 8.3 %{Hb} — ABNORMAL HIGH (ref <5.7)
Mean Plasma Glucose: 192 mg/dL
eAG (mmol/L): 10.6 mmol/L

## 2024-01-05 LAB — LIPID PANEL
Cholesterol: 187 mg/dL (ref <200)
HDL: 50 mg/dL (ref >=50)
LDL Cholesterol (Calc): 111 mg/dL — ABNORMAL HIGH
Non-HDL Cholesterol (Calc): 137 mg/dL — ABNORMAL HIGH (ref <130)
Total CHOL/HDL Ratio: 3.7 (calc) (ref <5.0)
Triglycerides: 150 mg/dL — ABNORMAL HIGH (ref <150)

## 2024-01-05 NOTE — Assessment & Plan Note (Signed)
 Chronic.  Not well controlled.  Pravastatin was increased to 80mg -check labs

## 2024-01-05 NOTE — Assessment & Plan Note (Signed)
 Chronic.  Controlled.  Continue glipizide ER 2.5 mg daily, metformin at 1000 mg twice daily, change ozempic 2mg  to mounjaro 7.5mg  to see if better control of wt.

## 2024-01-05 NOTE — Progress Notes (Signed)
 1.  A1C elevated.  Needs to work on diet and exercise.  Just changed to mounjaro.  Hopefully will help. 2.  Cholesterol too high.  Change pravastatin to crestor 40mg  daily

## 2024-01-05 NOTE — Assessment & Plan Note (Signed)
 chronic. Controlled. Cont hyzaar 100/25/amlodipine 10mg 

## 2024-01-07 ENCOUNTER — Other Ambulatory Visit (HOSPITAL_COMMUNITY): Payer: Self-pay

## 2024-01-07 ENCOUNTER — Other Ambulatory Visit: Payer: Self-pay | Admitting: *Deleted

## 2024-01-07 MED ORDER — ROSUVASTATIN CALCIUM 40 MG PO TABS
40.0000 mg | ORAL_TABLET | Freq: Every day | ORAL | 0 refills | Status: DC
  Filled 2024-01-07: qty 30, 30d supply, fill #0
  Filled 2024-02-11: qty 30, 30d supply, fill #1
  Filled 2024-02-25 – 2024-03-10 (×2): qty 30, 30d supply, fill #2

## 2024-01-08 ENCOUNTER — Other Ambulatory Visit: Payer: Self-pay | Admitting: Family Medicine

## 2024-01-08 DIAGNOSIS — I152 Hypertension secondary to endocrine disorders: Secondary | ICD-10-CM

## 2024-01-09 ENCOUNTER — Telehealth: Payer: Self-pay | Admitting: Pharmacy Technician

## 2024-01-09 ENCOUNTER — Other Ambulatory Visit (HOSPITAL_COMMUNITY): Payer: Self-pay

## 2024-01-09 NOTE — Telephone Encounter (Signed)
 Pharmacy Patient Advocate Encounter   Received notification from CoverMyMeds that prior authorization for [facility] 7.5MG /0.5ML PEN is required/requested.   Insurance verification completed.   The patient is insured through [employer] .   Per test claim: PA required; PA submitted to above mentioned insurance via CoverMyMeds Key/confirmation #/EOC [id number] Status is pending

## 2024-01-11 ENCOUNTER — Other Ambulatory Visit (HOSPITAL_COMMUNITY): Payer: Self-pay

## 2024-01-11 NOTE — Telephone Encounter (Signed)
 Pharmacy Patient Advocate Encounter  Received notification from [employer] that Prior Authorization for  [facility] 7.5MG /0.5ML PEN  has been APPROVED from 01/09/24 to 01/08/25. Ran test claim, Copay is $35. This test claim was processed through [facility]- copay amounts may vary at other pharmacies due to pharmacy/plan contracts, or as the patient moves through the different stages of their insurance plan.   PA #/Case ID/Reference #: [id number]

## 2024-01-24 ENCOUNTER — Other Ambulatory Visit: Payer: Self-pay

## 2024-01-24 ENCOUNTER — Other Ambulatory Visit (HOSPITAL_COMMUNITY): Payer: Self-pay

## 2024-01-28 ENCOUNTER — Other Ambulatory Visit (HOSPITAL_COMMUNITY): Payer: Self-pay

## 2024-01-30 ENCOUNTER — Other Ambulatory Visit (HOSPITAL_COMMUNITY): Payer: Self-pay

## 2024-01-30 ENCOUNTER — Other Ambulatory Visit: Payer: Self-pay

## 2024-02-01 ENCOUNTER — Other Ambulatory Visit (HOSPITAL_COMMUNITY): Payer: Self-pay

## 2024-02-01 ENCOUNTER — Other Ambulatory Visit: Payer: Self-pay

## 2024-02-23 ENCOUNTER — Emergency Department (HOSPITAL_COMMUNITY): Payer: PRIVATE HEALTH INSURANCE

## 2024-02-23 ENCOUNTER — Other Ambulatory Visit: Payer: Self-pay

## 2024-02-23 ENCOUNTER — Emergency Department (HOSPITAL_COMMUNITY)
Admission: EM | Admit: 2024-02-23 | Discharge: 2024-02-23 | Disposition: A | Discharge status: Home / Self Care | Payer: PRIVATE HEALTH INSURANCE

## 2024-02-23 DIAGNOSIS — Y990 Civilian activity done for income or pay: Secondary | ICD-10-CM | POA: Diagnosis not present

## 2024-02-23 DIAGNOSIS — W01198A Fall on same level from slipping, tripping and stumbling with subsequent striking against other object, initial encounter: Secondary | ICD-10-CM | POA: Insufficient documentation

## 2024-02-23 DIAGNOSIS — Z7982 Long term (current) use of aspirin: Secondary | ICD-10-CM | POA: Diagnosis not present

## 2024-02-23 DIAGNOSIS — S8001XA Contusion of right knee, initial encounter: Secondary | ICD-10-CM

## 2024-02-23 DIAGNOSIS — M25561 Pain in right knee: Secondary | ICD-10-CM | POA: Diagnosis present

## 2024-02-23 DIAGNOSIS — W19XXXA Unspecified fall, initial encounter: Secondary | ICD-10-CM

## 2024-02-23 DIAGNOSIS — S8002XA Contusion of left knee, initial encounter: Secondary | ICD-10-CM

## 2024-02-23 LAB — URINALYSIS, ROUTINE W REFLEX MICROSCOPIC
Bacteria, UA: NONE SEEN
Bilirubin Urine: NEGATIVE
Glucose, UA: NEGATIVE mg/dL
Hgb urine dipstick: NEGATIVE
Ketones, ur: NEGATIVE mg/dL
Nitrite: NEGATIVE
Protein, ur: NEGATIVE mg/dL
Specific Gravity, Urine: 1.014 (ref 1.005–1.030)
pH: 5 (ref 5.0–8.0)

## 2024-02-23 MED ORDER — ACETAMINOPHEN 500 MG PO TABS
1000.0000 mg | ORAL_TABLET | Freq: Once | ORAL | Status: AC
  Administered 2024-02-23: 1000 mg via ORAL
  Filled 2024-02-23: qty 2

## 2024-02-23 NOTE — ED Triage Notes (Signed)
 Pt POV d/t fall at work over foot stumbled over wheels of a wheelchair - c/o bilat knee pain and ABD pain - hit concrete.

## 2024-02-23 NOTE — Discharge Instructions (Signed)
 You may take overt the counter pain medications such as tylenol for pain. You may also Ice the knees to help reduce inflammation. Follow up with your primary doctor. Return if you develop fevers, chills headache, chest pain, shortness of breath, worsening abdominal pain, inability to eat or drink due to nausea and vomitting or you stop having bowel movements. You may also return if you develop any new or worsening symptoms that are concerning to you.

## 2024-02-23 NOTE — ED Provider Notes (Signed)
 [place] EMERGENCY DEPARTMENT AT [facility] Provider Note   CSN: [id number] Arrival date & time: 02/23/24  1512     History  Chief Complaint  Patient presents with   Stephanie Mccall    Stephanie Mccall is a 53 y.o. female.  53 year old female presenting emergency department with bilateral knee pain after stumbling over a wheelchair at work.  Did not hit her head no LOC.  Denies numbness tingling changes in sensation.  Ambulatory after the fall.   Fall       Home Medications Prior to Admission medications   Medication Sig Start Date End Date Taking? Authorizing Provider  Accu-Chek Softclix Lancets lancets Test up to twice daily as directed in the morning fasting and when hypoglycemic 10/27/21     acetaminophen (TYLENOL) 500 MG tablet Take 1,000 mg by mouth every 6 (six) hours as needed for headache, moderate pain or mild pain.    [provider]  amLODipine (NORVASC) 10 MG tablet Take 1 tablet (10 mg total) by mouth daily. 01/04/24   Christel Cousins, MD  aspirin-acetaminophen-caffeine (EXCEDRIN MIGRAINE) 250-250-65 MG tablet Take 2 tablets by mouth every 6 (six) hours as needed for headache.    [provider]  Blood Glucose Monitoring Suppl (BLOOD GLUCOSE MONITOR SYSTEM) w/Device KIT Test up to twice daily as directed in the morning fasting and when hypoglycemic 10/27/21     Blood Glucose Monitoring Suppl (CONTOUR NEXT ONE) DEVI Use as directed to monitor glucose twice daily. 01/04/24   Christel Cousins, MD  celecoxib (CELEBREX) 100 MG capsule Take 1 capsule (100 mg total) by mouth 2 (two) times daily. 11/28/23   Jasmine Mesi, MD  diclofenac (VOLTAREN) 75 MG EC tablet Take 1 tablet (75 mg total) by mouth 2 (two) times daily. Do not take with other NSAIDs such as Ibuprofen or Mobic 10/24/23   Magnant, Charles L, PA-C  glipiZIDE (GLUCOTROL XL) 2.5 MG 24 hr tablet Take 1 tablet (2.5 mg total) by mouth daily. 01/04/24   Christel Cousins, MD  glucose blood  [facility] VERIO) test strip Use to test glucose up to twice daily as directed in the morning and  as needed for hypoglycemia 01/04/24   Christel Cousins, MD  ketoconazole (NIZORAL) 2 % cream Apply 1 Application topically daily as needed for irritation on Face/scalp rash 01/04/24   Christel Cousins, MD  ketoconazole (NIZORAL) 2 % shampoo Apply 1 Application topically 2 (two) times a week. Patient taking differently: Apply 1 Application topically daily as needed for irritation. 06/04/23   Christel Cousins, MD  losartan-hydrochlorothiazide (HYZAAR) 100-25 MG tablet Take 1 tablet by mouth daily. 01/04/24   Christel Cousins, MD  metFORMIN (GLUCOPHAGE) 1000 MG tablet Take 1 tablet (1,000 mg total) by mouth 2 (two) times daily. 01/04/24   Christel Cousins, MD  Microlet Lancets MISC Use as directed to monitor glucose twice daily. 01/04/24   Christel Cousins, MD  potassium chloride (KLOR-CON M) 10 MEQ tablet Take 1 tablet (10 mEq total) by mouth 2 (two) times daily. 01/04/24   Christel Cousins, MD  rosuvastatin (CRESTOR) 40 MG tablet Take 1 tablet (40 mg total) by mouth daily. 01/07/24   Christel Cousins, MD  tirzepatide (MOUNJARO) 7.5 MG/0.5ML Pen Inject 7.5 mg into the skin once a week. 01/04/24   Christel Cousins, MD  triamcinolone cream (KENALOG) 0.1 % Apply 1 Application topically 2 (two) times daily. For rash 06/04/23   Christel Cousins, MD  diltiazem (DILACOR XR) 120 MG 24 hr capsule Take 1 capsule (120 mg total) by mouth daily. Take along with 240mg  dose to equal 360mg  daily 10/13/21 10/13/21        Allergies    Nitroglycerin    Review of Systems   Review of Systems  Physical Exam Updated Vital Signs BP (!) 159/89 (BP Location: Left Arm)   Pulse 90   Temp 98.4 F (36.9 C)   Resp 16   Ht 5\' 6"  (1.676 m)   Wt 119.7 kg   SpO2 98%   BMI 42.61 kg/m  Physical Exam Vitals and nursing note reviewed.  Constitutional:      Appearance: She is obese.  HENT:     Nose: Nose normal.  Eyes:     Extraocular  Movements: Extraocular movements intact.     Pupils: Pupils are equal, round, and reactive to light.  Cardiovascular:     Rate and Rhythm: Normal rate and regular rhythm.  Pulmonary:     Effort: Pulmonary effort is normal.  Abdominal:     General: Abdomen is flat. There is no distension.     Tenderness: There is no abdominal tenderness. There is no guarding or rebound.  Musculoskeletal:     Cervical back: Normal range of motion.     Comments: Minor tenderness to the bilateral anterior knee over the patella.  Full range of motion.  Neurovascularly intact.  Soft compartments.  Neurological:     Mental Status: She is alert.  Psychiatric:        Mood and Affect: Mood normal.        Behavior: Behavior normal.     ED Results / Procedures / Treatments   Labs (all labs ordered are listed, but only abnormal results are displayed) Labs Reviewed  URINALYSIS, ROUTINE W REFLEX MICROSCOPIC - Abnormal; Notable for the following components:      Result Value   Color, Urine STRAW (*)    Leukocytes,Ua TRACE (*)    All other components within normal limits    EKG None  Radiology DG Knee 2 Views Right Result Date: 02/23/2024 CLINICAL DATA:  Bilateral knee pain following a fall. 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 No fracture or dislocation seen on this limited two-view examination. Small effusion. IMPRESSION: 1. No fracture. 2. Small effusion. Electronically Signed   By: Catherin Closs M.D.   On: 02/23/2024 16:23   DG Knee 2 Views Left Result Date: 02/23/2024 CLINICAL DATA:  Bilateral knee pain following a fall. EXAM: LEFT KNEE - 1-2 VIEW COMPARISON:  None Available. FINDINGS: Mild-to-moderate medial joint space narrowing and spur formation. Mild lateral and patellofemoral spur formation. Minimal effusion seen. No fracture or dislocation visualized on this limited two-view examination. IMPRESSION: 1.  No fracture. 2. Minimal effusion. 3. Mild-to-moderate medial and mild lateral and patellofemoral degenerative changes. Electronically Signed   By: Catherin Closs M.D.   On: 02/23/2024 16:22    Procedures Procedures    Medications Ordered in ED Medications  acetaminophen (TYLENOL) tablet 1,000 mg (1,000 mg Oral Given 02/23/24 1652)    ED Course/ Medical Decision Making/ A&P Clinical Course as of 02/23/24 1937  Sat Feb 23, 2024  1635 Xrays negative. Stable for discharge.  [TY]    Clinical Course User Index [TY] Rolinda Climes, DO  Medical Decision Making This is a well-appearing 53 year old female presenting emergency department with knee pain after a fall.  Afebrile, nontachycardic, slightly hypertensive.  Reassuring exam, only some mild tenderness to her anterior knees.  Will get x-rays to evaluate for acute bony pathology.  Otherwise neurovascularly intact with soft compartments.  Low suspicion for vascular injury.  She is not on blood thinner, no indication for CT head/neck based on [place] CT head/C-spine rules.  No other injuries on exam.  If x-rays negative, patient appropriate for discharge and outpatient follow-up.  See ED course for further MDM and disposition.  Amount and/or Complexity of Data Reviewed External Data Reviewed:     Details: Not taking blood thinner. Labs: ordered.    Details: Benign abdominal exam, low suspicion for acute intra-abdominal pathology from ground-level fall. Radiology: ordered. Decision-making details documented in ED Course.  Risk OTC drugs. Decision regarding hospitalization.          Final Clinical Impression(s) / ED Diagnoses Final diagnoses:  Fall, initial encounter  Contusion of left knee, initial encounter  Contusion of right knee, initial encounter    Rx / DC Orders ED Discharge Orders     None         Rolinda Climes, DO 02/23/24 1937

## 2024-02-25 ENCOUNTER — Ambulatory Visit: Payer: Self-pay | Admitting: *Deleted

## 2024-02-25 ENCOUNTER — Other Ambulatory Visit: Payer: Self-pay

## 2024-02-25 ENCOUNTER — Other Ambulatory Visit (HOSPITAL_COMMUNITY): Payer: Self-pay

## 2024-02-25 NOTE — Telephone Encounter (Signed)
 02/23/24 patient fell at work and went to ED for evaluation. Urine results abnormal. Chief Complaint: urine results abnormal and patient requesting antibiotics Symptoms: frequent urination. 'feels like needles" with urination. "Fishy smell".  Frequency: 02/23/24 Pertinent Negatives: Patient denies fever no abdominal pain from urination per patient. No blood in urine  No c/o bilateral knee pain from fall. Disposition: [] ED /[] Urgent Care (no appt availability in office) / [] Appointment(In office/virtual)/ []  [place] [facility]/ [] Home Care/ [] Refused Recommended Disposition /[] [place] [facility]/ [x]  Follow-up with PCP Additional Notes:   Pt requesting antibiotics due to results she saw on My Chart results. Recommended PCP OV.  Patient would like to know if she can just get antibiotics. Patient would like a call back. Patient is aware PCP may want OV . Please advise.       Copied from CRM [id number]. Topic: Clinical - Red Word Triage >> Feb 25, 2024  8:23 AM Ivette P wrote: Red Word that prompted transfer to Nurse Triage:  fall at work and urinary analysis. believes has a UTI antibiotics to the pharmacy. Peeing - Fall saturday at work, went to emergency room. Reason for Disposition  Diabetes mellitus or weak immune system (e.g., HIV positive, cancer chemo, splenectomy, organ transplant, chronic steroids)  Answer Assessment - Initial Assessment Questions 1. SEVERITY: "How bad is the pain?"  (e.g., Scale 1-10; mild, moderate, or severe)   - MILD (1-3): complains slightly about urination hurting   - MODERATE (4-7): interferes with normal activities     - SEVERE (8-10): excruciating, unwilling or unable to urinate because of the pain      Pain feels like "needle passing through' 2. FREQUENCY: "How many times have you had painful urination today?"      Na  3. PATTERN: "Is pain present every time you urinate or just sometimes?"      Na  4. ONSET: "When did the painful urination start?"       Abnormal urine results after fall 02/23/24 5. FEVER: "Do you have a fever?" If Yes, ask: "What is your temperature, how was it measured, and when did it start?"     na 6. PAST UTI: "Have you had a urine infection before?" If Yes, ask: "When was the last time?" and "What happened that time?"      na 7. CAUSE: "What do you think is causing the painful urination?"  (e.g., UTI, scratch, Herpes sore)     UTI 8. OTHER SYMPTOMS: "Do you have any other symptoms?" (e.g., blood in urine, flank pain, genital sores, urgency, vaginal discharge)     Urinary frequency , "feels like needle"with urination. Fishy smell 9. PREGNANCY: "Is there any chance you are pregnant?" "When was your last menstrual period?"     na  Protocols used: Urination Pain - Female-A-AH

## 2024-02-26 ENCOUNTER — Other Ambulatory Visit (HOSPITAL_COMMUNITY): Payer: Self-pay

## 2024-02-27 ENCOUNTER — Other Ambulatory Visit (HOSPITAL_COMMUNITY): Payer: Self-pay

## 2024-02-28 ENCOUNTER — Encounter: Payer: Self-pay | Admitting: Obstetrics and Gynecology

## 2024-02-28 ENCOUNTER — Ambulatory Visit: Payer: PRIVATE HEALTH INSURANCE | Admitting: Obstetrics and Gynecology

## 2024-02-28 ENCOUNTER — Other Ambulatory Visit (HOSPITAL_COMMUNITY)
Admission: RE | Admit: 2024-02-28 | Discharge: 2024-02-28 | Disposition: A | Discharge status: Home / Self Care | Payer: PRIVATE HEALTH INSURANCE | Source: Ambulatory Visit | Attending: Obstetrics and Gynecology | Admitting: Obstetrics and Gynecology

## 2024-02-28 VITALS — BP 153/89 | HR 62 | Ht 66.0 in | Wt 264.0 lb

## 2024-02-28 DIAGNOSIS — R102 Pelvic and perineal pain: Secondary | ICD-10-CM

## 2024-02-28 DIAGNOSIS — Z124 Encounter for screening for malignant neoplasm of cervix: Secondary | ICD-10-CM | POA: Insufficient documentation

## 2024-02-28 DIAGNOSIS — Z1231 Encounter for screening mammogram for malignant neoplasm of breast: Secondary | ICD-10-CM | POA: Diagnosis not present

## 2024-02-28 DIAGNOSIS — Z113 Encounter for screening for infections with a predominantly sexual mode of transmission: Secondary | ICD-10-CM | POA: Diagnosis present

## 2024-02-28 DIAGNOSIS — Z01419 Encounter for gynecological examination (general) (routine) without abnormal findings: Secondary | ICD-10-CM | POA: Insufficient documentation

## 2024-02-28 DIAGNOSIS — R399 Unspecified symptoms and signs involving the genitourinary system: Secondary | ICD-10-CM | POA: Diagnosis not present

## 2024-02-28 DIAGNOSIS — Z1339 Encounter for screening examination for other mental health and behavioral disorders: Secondary | ICD-10-CM

## 2024-02-28 NOTE — Progress Notes (Signed)
 NEW GYNECOLOGY VISIT Chief Complaint  Patient presents with   NEW PATIENT/GYN     Subjective:  Stephanie Mccall is a 53 y.o. G2P2 who presents for annual and concern of pelvic pain  She reports urinary frequency with burning. Had recent UA on 4/12 that just showed trace leu. She has history of diabetes. Reports hysterectomy for fibroids about 10 years ago. Still has her ovaries. Notes pelvic pain on/off for the last few months    Gyn History: No LMP recorded. Patient has had a hysterectomy. Last pap: uncertain History of abnormal pap: Yes: years ago but denies LEEP/[facility] history Last mammogram: 1-2 years ago Last colonoscopy: last year Last DEXA: never, denies risk factors  OB History     Gravida  2   Para  2   Term      Preterm      AB      Living  2      SAB      IAB      Ectopic      Multiple      Live Births  2           Past Medical History:  Diagnosis Date   Allergy    SEASONAL   Arthritis    SHOULDERS   Diabetes mellitus without complication (HCC)    Dysrhythmia    Fibroid    Hyperlipidemia    Hypertension    Sleep apnea     Past Surgical History:  Procedure Laterality Date   ABDOMINAL HYSTERECTOMY     partial. fibroids   ANKLE SURGERY Left    screws to straighten   CHOLECYSTECTOMY     HARDWARE REMOVAL Left 08/03/2023   Procedure: REMOVAL HARDWARE LEFT FOOT;  Surgeon: Nadara Mustard, MD;  Location: [facility] OR;  Service: Orthopedics;  Laterality: Left;   KNEE ARTHROSCOPY Right     Social History   Socioeconomic History   Marital status: Legally Separated    Spouse name: Not on file   Number of children: 2   Years of education: Not on file   Highest education level: Not on file  Occupational History   Not on file  Tobacco Use   Smoking status: Never    Passive exposure: Past   Smokeless tobacco: Never  Vaping Use   Vaping status: Never Used  Substance and Sexual Activity   Alcohol use: Yes    Comment: occasoinally    Drug use: Never   Sexual activity: Not Currently    Birth control/protection: Surgical  Other Topics Concern   Not on file  Social History Narrative   Daughter in [place], son dec 29-drowned at sea   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[employer]   [employer]   Social Drivers of Health   Financial Resource Strain: Not on file  Food Insecurity: Not on file  Transportation Needs: Not on file  Physical Activity: Not on file  Stress: Not on file  Social Connections: Not on file    Family History  Problem Relation Age of Onset   Cancer Maternal Aunt    Lung cancer Maternal Aunt    Cancer Maternal Uncle    Lung cancer Maternal Uncle    Cancer Maternal Grandmother    Breast cancer Maternal Grandmother    Colon cancer Neg Hx    Colon polyps Neg Hx    Crohn's disease Neg Hx    Esophageal cancer Neg Hx    Rectal cancer Neg Hx    Stomach cancer Neg Hx  Ulcerative colitis Neg Hx     Current Outpatient Medications on File Prior to Visit  Medication Sig Dispense Refill   Accu-Chek Softclix Lancets lancets Test up to twice daily as directed in the morning fasting and when hypoglycemic 100 each 1   acetaminophen (TYLENOL) 500 MG tablet Take 1,000 mg by mouth every 6 (six) hours as needed for headache, moderate pain or mild pain.     amLODipine (NORVASC) 10 MG tablet Take 1 tablet (10 mg total) by mouth daily. 90 tablet 3   aspirin-acetaminophen-caffeine (EXCEDRIN MIGRAINE) 250-250-65 MG tablet Take 2 tablets by mouth every 6 (six) hours as needed for headache.     Blood Glucose Monitoring Suppl (BLOOD GLUCOSE MONITOR SYSTEM) w/Device KIT Test up to twice daily as directed in the morning fasting and when hypoglycemic 1 kit 0   Blood Glucose Monitoring Suppl (CONTOUR NEXT ONE) DEVI Use as directed to monitor glucose twice daily. 1 each 3   celecoxib (CELEBREX) 100 MG capsule Take 1 capsule (100 mg total) by mouth 2 (two) times daily. 60 capsule 0   diclofenac (VOLTAREN) 75 MG EC tablet Take 1 tablet (75 mg total) by  mouth 2 (two) times daily. Do not take with other NSAIDs such as Ibuprofen or Mobic 60 tablet 2   glipiZIDE (GLUCOTROL XL) 2.5 MG 24 hr tablet Take 1 tablet (2.5 mg total) by mouth daily. 90 tablet 1   glucose blood (ONETOUCH VERIO) test strip Use to test glucose up to twice daily as directed in the morning and  as needed for hypoglycemia 50 strip 3   ketoconazole (NIZORAL) 2 % cream Apply 1 Application topically daily as needed for irritation on Face/scalp rash 15 g 1   ketoconazole (NIZORAL) 2 % shampoo Apply 1 Application topically 2 (two) times a week. (Patient taking differently: Apply 1 Application topically daily as needed for irritation.) 120 mL 3   losartan-hydrochlorothiazide (HYZAAR) 100-25 MG tablet Take 1 tablet by mouth daily. 90 tablet 0   metFORMIN (GLUCOPHAGE) 1000 MG tablet Take 1 tablet (1,000 mg total) by mouth 2 (two) times daily. 180 tablet 0   Microlet Lancets MISC Use as directed to monitor glucose twice daily. 100 each 1   potassium chloride (KLOR-CON M) 10 MEQ tablet Take 1 tablet (10 mEq total) by mouth 2 (two) times daily. 90 tablet 1   rosuvastatin (CRESTOR) 40 MG tablet Take 1 tablet (40 mg total) by mouth daily. 90 tablet 0   tirzepatide (MOUNJARO) 7.5 MG/0.5ML Pen Inject 7.5 mg into the skin once a week. 2 mL 1   triamcinolone cream (KENALOG) 0.1 % Apply 1 Application topically 2 (two) times daily. For rash 30 g 0   [DISCONTINUED] diltiazem (DILACOR XR) 120 MG 24 hr capsule Take 1 capsule (120 mg total) by mouth daily. Take along with 240mg  dose to equal 360mg  daily 30 capsule 3   No current facility-administered medications on file prior to visit.    Allergies  Allergen Reactions   Nitroglycerin Itching     Objective:   Vitals:   02/28/24 0814  BP: (!) 151/85  Pulse: 64  Weight: 264 lb (119.7 kg)  Height: 5\' 6"  (1.676 m)   Physical Examination:   General appearance - well appearing, and in no distress  Mental status - alert, oriented to person,  place, and time  Psych:  normal mood and affect  Skin - warm and dry, normal color, no suspicious lesions noted  Chest - effort normal, all lung fields  clear to auscultation bilaterally  Heart - normal rate and regular rhythm  Neck:  midline trachea, no thyromegaly or nodules  Breasts - breasts appear normal, no suspicious masses, no skin or nipple changes or  axillary nodes  Abdomen - soft, nontender, nondistended, no masses or organomegaly  Pelvic -  VULVA: normal appearing vulva with no masses, tenderness or lesions   VAGINA: normal appearing vagina with normal color and discharge, no lesions   CERVIX: normal appearing cervix without discharge or lesions, no CMT  Thin prep pap is done with HR HPV cotesting  UTERUS: surgically absent  ADNEXA: No adnexal masses or tenderness noted.  Extremities:  No swelling or varicosities noted  Chaperone present for exam  Assessment and Plan:  Kennis Buell is a 53 y.o.   1. Well woman exam with routine gynecological exam (Primary) Pap/HPV Mammo Colonoscopy up to date DEXA age 68 - Cytology - PAP( [place])  2. Cervical cancer screening  - Cytology - PAP( [place])  3. Encounter for screening mammogram for malignant neoplasm of breast  - [facility] 3D SCREENING MAMMOGRAM BILATERAL BREAST; Future  4. Urinary symptom or sign Will send for culture given recent unremarkable UA - Urine Culture  5. Pelvic pain Ucx sent, swab done to r/o infection. Pelvic ultrasound to evaluate ovaries - Urine Culture - US  PELVIC COMPLETE WITH TRANSVAGINAL; Future  6. Routine screening for STI (sexually transmitted infection)  - Cervicovaginal ancillary only( [place]) - HIV antibody (with reflex) - Hepatitis C Antibody - Hepatitis B Surface AntiGEN - [facility]PR    No follow-ups on file.  Future Appointments  Date Time Provider Department Center  03/05/2024  1:00 PM Christel Cousins, MD [facility]  04/02/2024  8:30 AM Christel Cousins, MD [facility]  [facility]    Marci Setter, MD, FACOG Obstetrician & Gynecologist, [facility] for [facility]

## 2024-02-28 NOTE — Progress Notes (Signed)
 53 y.o. New GYN presents for AEX/STD screening. C/o pelvic and abdominal pain 5-8/10 x 6 months, urinary frequency, dysuria.

## 2024-02-29 LAB — CYTOLOGY - PAP
Comment: NEGATIVE
Diagnosis: NEGATIVE
High risk HPV: NEGATIVE

## 2024-02-29 LAB — CERVICOVAGINAL ANCILLARY ONLY
Bacterial Vaginitis (gardnerella): NEGATIVE
Candida Glabrata: NEGATIVE
Candida Vaginitis: NEGATIVE
Chlamydia: NEGATIVE
Comment: NEGATIVE
Comment: NEGATIVE
Comment: NEGATIVE
Comment: NEGATIVE
Comment: NEGATIVE
Comment: NORMAL
Neisseria Gonorrhea: NEGATIVE
Trichomonas: NEGATIVE

## 2024-02-29 LAB — RPR: RPR Ser Ql: NONREACTIVE

## 2024-02-29 LAB — HEPATITIS B SURFACE ANTIGEN: Hepatitis B Surface Ag: NEGATIVE

## 2024-02-29 LAB — HIV ANTIBODY (ROUTINE TESTING W REFLEX): HIV Screen 4th Generation wRfx: NONREACTIVE

## 2024-02-29 LAB — HEPATITIS C ANTIBODY: Hep C Virus Ab: NONREACTIVE

## 2024-03-03 ENCOUNTER — Other Ambulatory Visit: Payer: Self-pay

## 2024-03-03 ENCOUNTER — Encounter: Payer: Self-pay | Admitting: Obstetrics and Gynecology

## 2024-03-03 ENCOUNTER — Other Ambulatory Visit (HOSPITAL_COMMUNITY): Payer: Self-pay

## 2024-03-05 ENCOUNTER — Ambulatory Visit
Admission: RE | Admit: 2024-03-05 | Discharge: 2024-03-05 | Disposition: A | Discharge status: Home / Self Care | Payer: PRIVATE HEALTH INSURANCE | Source: Ambulatory Visit | Attending: Obstetrics and Gynecology | Admitting: Obstetrics and Gynecology

## 2024-03-05 ENCOUNTER — Inpatient Hospital Stay: Payer: PRIVATE HEALTH INSURANCE | Admitting: Family Medicine

## 2024-03-05 DIAGNOSIS — Z1231 Encounter for screening mammogram for malignant neoplasm of breast: Secondary | ICD-10-CM

## 2024-03-10 ENCOUNTER — Ambulatory Visit (HOSPITAL_COMMUNITY)
Admission: RE | Admit: 2024-03-10 | Discharge: 2024-03-10 | Disposition: A | Discharge status: Home / Self Care | Payer: PRIVATE HEALTH INSURANCE | Source: Ambulatory Visit | Attending: Obstetrics and Gynecology | Admitting: Obstetrics and Gynecology

## 2024-03-10 DIAGNOSIS — R102 Pelvic and perineal pain: Secondary | ICD-10-CM | POA: Diagnosis present

## 2024-03-11 ENCOUNTER — Other Ambulatory Visit: Payer: Self-pay

## 2024-03-11 ENCOUNTER — Other Ambulatory Visit: Payer: Self-pay | Admitting: Family Medicine

## 2024-03-11 ENCOUNTER — Other Ambulatory Visit (HOSPITAL_COMMUNITY): Payer: Self-pay

## 2024-03-12 LAB — URINE CULTURE

## 2024-03-17 ENCOUNTER — Other Ambulatory Visit: Payer: Self-pay | Admitting: Family Medicine

## 2024-03-17 DIAGNOSIS — E119 Type 2 diabetes mellitus without complications: Secondary | ICD-10-CM

## 2024-03-18 ENCOUNTER — Encounter: Payer: Self-pay | Admitting: Nurse Practitioner

## 2024-03-18 ENCOUNTER — Ambulatory Visit: Payer: PRIVATE HEALTH INSURANCE | Admitting: Nurse Practitioner

## 2024-03-18 VITALS — BP 150/88 | HR 78 | Temp 97.5°F

## 2024-03-18 DIAGNOSIS — I152 Hypertension secondary to endocrine disorders: Secondary | ICD-10-CM

## 2024-03-18 DIAGNOSIS — E1159 Type 2 diabetes mellitus with other circulatory complications: Secondary | ICD-10-CM

## 2024-03-18 DIAGNOSIS — Z789 Other specified health status: Secondary | ICD-10-CM

## 2024-03-18 NOTE — Progress Notes (Signed)
 Occupational [facility]- [facility]  Subjective:  Patient ID: Stephanie Mccall, female    DOB: 04-Jul-1971  Age: 53 y.o. MRN: [id number]  CC: Wellness Exam   HPI Stephanie Mccall presents for wellness exam visit for insurance benefit.  Patient has a PCP:Dr.Kulik PMH significant for: HTN, type 2 Diabetes  Last labs per PCP were completed: Feb 2025  Health Maintenance:  Colonoscopy: 2023, repeat in 10 years Mammogram: April 2025 Pap: April 2025    Smoker:Never  Immunizations:  Shingrix -  completed Flu- declines  Lifestyle: Diet- tries to eat well  Exercise- very active at work.    HTN- reports BP at home is around 140's-160's at home. Endorses taking medications as prescribed.    Past Medical History:  Diagnosis Date   Allergy    SEASONAL   Arthritis    SHOULDERS   Diabetes mellitus without complication (HCC)    Dysrhythmia    Fibroid    Hyperlipidemia    Hypertension    Sleep apnea     Past Surgical History:  Procedure Laterality Date   ABDOMINAL HYSTERECTOMY     partial. fibroids   ANKLE SURGERY Left    screws to straighten   CHOLECYSTECTOMY     HARDWARE REMOVAL Left 08/03/2023   Procedure: REMOVAL HARDWARE LEFT FOOT;  Surgeon: Timothy Ford, MD;  Location: [facility] OR;  Service: Orthopedics;  Laterality: Left;   KNEE ARTHROSCOPY Right     Outpatient Medications Prior to Visit  Medication Sig Dispense Refill   amLODipine  (NORVASC ) 10 MG tablet Take 1 tablet (10 mg total) by mouth daily. 90 tablet 3   celecoxib  (CELEBREX ) 100 MG capsule Take 1 capsule (100 mg total) by mouth 2 (two) times daily. 60 capsule 0   diclofenac  (VOLTAREN ) 75 MG EC tablet Take 1 tablet (75 mg total) by mouth 2 (two) times daily. Do not take with other NSAIDs such as Ibuprofen  or Mobic  60 tablet 2   glipiZIDE  (GLUCOTROL  XL) 2.5 MG 24 hr tablet Take 1 tablet (2.5 mg total) by mouth daily. 90 tablet 1   losartan -hydrochlorothiazide  (HYZAAR ) 100-25 MG tablet Take 1 tablet by mouth daily. 90 tablet 0    metFORMIN  (GLUCOPHAGE ) 1000 MG tablet Take 1 tablet by mouth twice daily 180 tablet 0   potassium chloride  (KLOR-CON  M) 10 MEQ tablet Take 1 tablet (10 mEq total) by mouth 2 (two) times daily. 90 tablet 1   rosuvastatin  (CRESTOR ) 40 MG tablet Take 1 tablet (40 mg total) by mouth daily. 90 tablet 0   tirzepatide  (MOUNJARO ) 7.5 MG/0.5ML Pen Inject 7.5 mg into the skin once a week. 2 mL 1   Accu-Chek Softclix Lancets lancets Test up to twice daily as directed in the morning fasting and when hypoglycemic 100 each 1   acetaminophen  (TYLENOL ) 500 MG tablet Take 1,000 mg by mouth every 6 (six) hours as needed for headache, moderate pain or mild pain.     aspirin -acetaminophen -caffeine (EXCEDRIN MIGRAINE) 250-250-65 MG tablet Take 2 tablets by mouth every 6 (six) hours as needed for headache.     Blood Glucose Monitoring Suppl (BLOOD GLUCOSE MONITOR SYSTEM) w/Device KIT Test up to twice daily as directed in the morning fasting and when hypoglycemic 1 kit 0   Blood Glucose Monitoring Suppl (CONTOUR NEXT ONE) DEVI Use as directed to monitor glucose twice daily. 1 each 3   glucose blood (ONETOUCH VERIO) test strip Use to test glucose up to twice daily as directed in the morning and  as needed for hypoglycemia 50  strip 3   ketoconazole  (NIZORAL ) 2 % cream APPLY 1 CREAM TOPICALLY ONCE DAILY. FACE/SCALP RASH 15 g 0   ketoconazole  (NIZORAL ) 2 % shampoo Apply 1 Application topically 2 (two) times a week. (Patient taking differently: Apply 1 Application topically daily as needed for irritation.) 120 mL 3   Microlet Lancets MISC Use as directed to monitor glucose twice daily. 100 each 1   triamcinolone  cream (KENALOG ) 0.1 % APPLY CREAM EXTERNALLY TWICE DAILY FOR RASH 30 g 0   No facility-administered medications prior to visit.    ROS Review of Systems  Constitutional:  Negative for fatigue.  HENT:  Negative for ear pain and hearing loss.   Eyes:  Negative for visual disturbance.  Respiratory:  Negative for  shortness of breath.   Cardiovascular:  Negative for chest pain.  Gastrointestinal:  Positive for constipation. Negative for diarrhea.  Musculoskeletal:  Positive for arthralgias (both shoulders- has been evaluated by PCP).  Neurological:  Negative for headaches.    Objective:  BP (!) 150/88   Pulse 78   Temp (!) 97.5 F (36.4 C) (Temporal)   SpO2 98%   Physical Exam Constitutional:      General: She is not in acute distress. HENT:     Head: Normocephalic.     Right Ear: Tympanic membrane, ear canal and external ear normal.     Left Ear: Tympanic membrane, ear canal and external ear normal.     Nose: Nose normal.     Mouth/Throat:     Mouth: Mucous membranes are moist.     Pharynx: No oropharyngeal exudate.  Eyes:     Pupils: Pupils are equal, round, and reactive to light.  Cardiovascular:     Rate and Rhythm: Normal rate and regular rhythm.  Pulmonary:     Effort: Pulmonary effort is normal.     Breath sounds: Normal breath sounds.  Musculoskeletal:        General: Normal range of motion.     Right lower leg: No edema.     Left lower leg: No edema.  Skin:    General: Skin is warm.  Neurological:     General: No focal deficit present.     Mental Status: She is alert and oriented to person, place, and time.  Psychiatric:        Mood and Affect: Mood normal.        Behavior: Behavior normal.      Assessment & Plan:    Stephanie Mccall was seen today for wellness exam.  Diagnoses and all orders for this visit:  Participant in health and wellness plan Adult wellness physical was conducted today. Importance of diet and exercise were discussed in detail.  We reviewed immunizations and gave recommendations regarding current immunization needed for age.  Preventative health exams are up to date.   Patient was advised yearly wellness exam  Hypertension associated with diabetes (HCC) BP is uncontrolled despite being on several antihypertensives. Discussed BP goal with  patient and encouraged her to follow-up with PCP for further management. Continue pursuing lifestyle modifications and monitoring BP at home. She has a scheduled follow-up with PCP later this month.    No orders of the defined types were placed in this encounter.   No orders of the defined types were placed in this encounter.   Follow-up: as needed.

## 2024-03-21 ENCOUNTER — Encounter: Payer: Self-pay | Admitting: Obstetrics and Gynecology

## 2024-04-02 ENCOUNTER — Other Ambulatory Visit (HOSPITAL_COMMUNITY): Payer: Self-pay

## 2024-04-02 ENCOUNTER — Encounter: Payer: Self-pay | Admitting: Family Medicine

## 2024-04-02 ENCOUNTER — Telehealth: Payer: Self-pay

## 2024-04-02 ENCOUNTER — Ambulatory Visit: Payer: PRIVATE HEALTH INSURANCE | Admitting: Family Medicine

## 2024-04-02 ENCOUNTER — Ambulatory Visit: Payer: Self-pay | Admitting: Family Medicine

## 2024-04-02 VITALS — BP 132/84 | HR 70 | Temp 97.5°F | Resp 18 | Ht 66.0 in | Wt 265.4 lb

## 2024-04-02 DIAGNOSIS — Z7985 Long-term (current) use of injectable non-insulin antidiabetic drugs: Secondary | ICD-10-CM | POA: Diagnosis not present

## 2024-04-02 DIAGNOSIS — E785 Hyperlipidemia, unspecified: Secondary | ICD-10-CM

## 2024-04-02 DIAGNOSIS — E1169 Type 2 diabetes mellitus with other specified complication: Secondary | ICD-10-CM | POA: Diagnosis not present

## 2024-04-02 DIAGNOSIS — E1159 Type 2 diabetes mellitus with other circulatory complications: Secondary | ICD-10-CM

## 2024-04-02 DIAGNOSIS — E119 Type 2 diabetes mellitus without complications: Secondary | ICD-10-CM

## 2024-04-02 DIAGNOSIS — I152 Hypertension secondary to endocrine disorders: Secondary | ICD-10-CM | POA: Diagnosis not present

## 2024-04-02 LAB — CBC WITH DIFFERENTIAL/PLATELET
Basophils Absolute: 0 10*3/uL (ref 0.0–0.1)
Basophils Relative: 0.6 % (ref 0.0–3.0)
Eosinophils Absolute: 0.1 10*3/uL (ref 0.0–0.7)
Eosinophils Relative: 3.2 % (ref 0.0–5.0)
HCT: 40.6 % (ref 36.0–46.0)
Hemoglobin: 13.3 g/dL (ref 12.0–15.0)
Lymphocytes Relative: 38 % (ref 12.0–46.0)
Lymphs Abs: 1.7 10*3/uL (ref 0.7–4.0)
MCHC: 32.7 g/dL (ref 30.0–36.0)
MCV: 83.7 fl (ref 78.0–100.0)
Monocytes Absolute: 0.3 10*3/uL (ref 0.1–1.0)
Monocytes Relative: 5.7 % (ref 3.0–12.0)
Neutro Abs: 2.4 10*3/uL (ref 1.4–7.7)
Neutrophils Relative %: 52.5 % (ref 43.0–77.0)
Platelets: 254 10*3/uL (ref 150.0–400.0)
RBC: 4.85 Mil/uL (ref 3.87–5.11)
RDW: 14.2 % (ref 11.5–15.5)
WBC: 4.6 10*3/uL (ref 4.0–10.5)

## 2024-04-02 LAB — COMPREHENSIVE METABOLIC PANEL WITH GFR
ALT: 21 U/L (ref 0–35)
AST: 19 U/L (ref 0–37)
Albumin: 4.5 g/dL (ref 3.5–5.2)
Alkaline Phosphatase: 71 U/L (ref 39–117)
BUN: 20 mg/dL (ref 6–23)
CO2: 26 meq/L (ref 19–32)
Calcium: 9.7 mg/dL (ref 8.4–10.5)
Chloride: 101 meq/L (ref 96–112)
Creatinine, Ser: 0.69 mg/dL (ref 0.40–1.20)
GFR: 98.66 mL/min (ref >60.00)
Glucose, Bld: 148 mg/dL — ABNORMAL HIGH (ref 70–99)
Potassium: 3.8 meq/L (ref 3.5–5.1)
Sodium: 137 meq/L (ref 135–145)
Total Bilirubin: 0.4 mg/dL (ref 0.2–1.2)
Total Protein: 7.9 g/dL (ref 6.0–8.3)

## 2024-04-02 LAB — LIPID PANEL
Cholesterol: 156 mg/dL (ref 0–200)
HDL: 45.9 mg/dL (ref >39.00)
LDL Cholesterol: 90 mg/dL (ref 0–99)
NonHDL: 110.47
Total CHOL/HDL Ratio: 3
Triglycerides: 101 mg/dL (ref 0.0–149.0)
VLDL: 20.2 mg/dL (ref 0.0–40.0)

## 2024-04-02 LAB — HEMOGLOBIN A1C: Hgb A1c MFr Bld: 8 % — ABNORMAL HIGH (ref 4.6–6.5)

## 2024-04-02 LAB — VITAMIN B12: Vitamin B-12: 883 pg/mL (ref 211–911)

## 2024-04-02 MED ORDER — TIRZEPATIDE 10 MG/0.5ML ~~LOC~~ SOAJ
10.0000 mg | SUBCUTANEOUS | 2 refills | Status: DC
Start: 2024-04-02 — End: 2024-07-03
  Filled 2024-04-02 – 2024-04-03 (×3): qty 2, 28d supply, fill #0
  Filled 2024-05-03: qty 2, 28d supply, fill #1
  Filled 2024-05-31: qty 2, 28d supply, fill #2

## 2024-04-02 MED ORDER — METFORMIN HCL 1000 MG PO TABS
1000.0000 mg | ORAL_TABLET | Freq: Two times a day (BID) | ORAL | 1 refills | Status: DC
Start: 2024-04-02 — End: 2024-10-03
  Filled 2024-04-02: qty 180, 90d supply, fill #0
  Filled 2024-04-08 – 2024-05-28 (×3): qty 60, 30d supply, fill #0
  Filled 2024-08-17: qty 60, 30d supply, fill #1
  Filled 2024-09-14: qty 60, 30d supply, fill #2

## 2024-04-02 MED ORDER — LOSARTAN POTASSIUM-HCTZ 100-25 MG PO TABS
1.0000 | ORAL_TABLET | Freq: Every day | ORAL | 1 refills | Status: DC
  Filled 2024-04-02 – 2024-04-22 (×2): qty 30, 30d supply, fill #0
  Filled 2024-05-28: qty 30, 30d supply, fill #1
  Filled 2024-06-30: qty 30, 30d supply, fill #2
  Filled 2024-07-29: qty 30, 30d supply, fill #3
  Filled 2024-09-04: qty 30, 30d supply, fill #4

## 2024-04-02 MED ORDER — GLIPIZIDE ER 5 MG PO TB24
5.0000 mg | ORAL_TABLET | Freq: Every day | ORAL | 1 refills | Status: DC
  Filled 2024-04-02: qty 30, 30d supply, fill #0
  Filled 2024-05-19: qty 30, 30d supply, fill #1
  Filled 2024-06-13: qty 30, 30d supply, fill #2
  Filled 2024-07-15: qty 30, 30d supply, fill #3
  Filled 2024-08-17: qty 30, 30d supply, fill #4
  Filled 2024-09-14: qty 30, 30d supply, fill #5

## 2024-04-02 MED ORDER — ROSUVASTATIN CALCIUM 40 MG PO TABS
40.0000 mg | ORAL_TABLET | Freq: Every day | ORAL | 1 refills | Status: DC
Start: 2024-04-02 — End: 2024-10-03
  Filled 2024-04-02: qty 90, 90d supply, fill #0
  Filled 2024-04-22: qty 30, 30d supply, fill #0
  Filled 2024-05-19: qty 30, 30d supply, fill #1
  Filled 2024-06-25 (×2): qty 30, 30d supply, fill #2
  Filled 2024-07-29: qty 30, 30d supply, fill #3
  Filled 2024-09-04: qty 30, 30d supply, fill #4

## 2024-04-02 MED ORDER — POTASSIUM CHLORIDE CRYS ER 10 MEQ PO TBCR
10.0000 meq | EXTENDED_RELEASE_TABLET | Freq: Two times a day (BID) | ORAL | 1 refills | Status: DC
  Filled 2024-04-02: qty 60, 30d supply, fill #0
  Filled 2024-05-31: qty 60, 30d supply, fill #1
  Filled 2024-08-17: qty 60, 30d supply, fill #2
  Filled 2024-09-14: qty 60, 30d supply, fill #3

## 2024-04-02 NOTE — Patient Instructions (Addendum)
 It was very nice to see you today!  Glipizide -increase to 5mg  in the morning Mounjaro -increase to 10mg  weekly.   Get eye exam done   PLEASE NOTE:  If you had any lab tests please let us  know if you have not heard back within a few days. You may see your results on MyChart before we have a chance to review them but we will give you a call once they are reviewed by us . If we ordered any referrals today, please let us  know if you have not heard from their office within the next week.   Please try these tips to maintain a healthy lifestyle:  Eat most of your calories during the day when you are active. Eliminate processed foods including packaged sweets (pies, cakes, cookies), reduce intake of potatoes, white bread, white pasta, and white rice. Look for whole grain options, oat flour or almond flour.  Each meal should contain half fruits/vegetables, one quarter protein, and one quarter carbs (no bigger than a computer mouse).  Cut down on sweet beverages. This includes juice, soda, and sweet tea. Also watch fruit intake, though this is a healthier sweet option, it still contains natural sugar! Limit to 3 servings daily.  Drink at least 1 glass of water with each meal and aim for at least 8 glasses per day  Exercise at least 150 minutes every week.

## 2024-04-02 NOTE — Progress Notes (Signed)
 Subjective:     Patient ID: Stephanie Mccall, female    DOB: June 26, 1971, 53 y.o.   MRN: [id number]  Chief Complaint  Patient presents with   Medical Management of Chronic Issues    3 month follow-up Fasting     HPI Discussed the use of AI scribe software for clinical note transcription with the patient, who gave verbal consent to proceed.  History of Present Illness Stephanie Mccall is a 53 year old female with diabetes and hypertension who presents for management of her diabetes and associated symptoms.  She is currently taking Mounjaro  7.5 mg weekly for her diabetes but has not noticed significant changes in her blood sugar levels and has not seen much of a change in sugar. Her morning blood sugar readings are around 151 mg/dL before eating, increasing to 200 mg/dL by 10 AM without food intake(only occ), accompanied by lightheadedness and dizziness. Later in the day, after taking her medication, her blood sugar decreases to 170 mg/dL or sometimes 409 mg/dL. She is also on metformin  1000 mg twice daily and glipizide  2.5 mg daily. Despite dietary efforts to avoid fatty foods, starches, and sugary drinks, she feels her diet is not significantly impacting her blood sugar levels. 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 Occ tingle in feet  For her hypertension, she is taking amlodipine  10 mg daily and Hyzaar  100/25 mg daily, along with potassium twice daily. No issues with chest pain, shortness of breath, or leg swelling.  She is on rosuvastatin  40mg  for cholesterol management and experiences occasional constipation, which she attributes to Mounjaro . She experiences mild depression at times but does not find it concerning.  She suffers from arthritis, particularly in her left shoulder, causing significant pain and sleep disruption. She has received multiple injections over the past two years, providing only temporary relief.  Various pain medications, including Celebrex , Tylenol , and ibuprofen , have had limited success. Her shoulder pain sometimes causes her hand to go numb and get stuck, requiring time to release. Needs surgery but "don't want to be cut on anymore"  She has not yet had her eye exam due to worsening vision, which she suspects may be related to her diabetes. She has completed her mammogram and pelvic exam, both of which were normal.    Health Maintenance Due  Topic Date Due   OPHTHALMOLOGY EXAM  07/29/2023    Past Medical History:  Diagnosis Date   Allergy    SEASONAL   Arthritis    SHOULDERS   Diabetes mellitus without complication (HCC)    Dysrhythmia    Fibroid    Hyperlipidemia    Hypertension    Sleep apnea     Past Surgical History:  Procedure Laterality Date   ABDOMINAL HYSTERECTOMY     partial. fibroids   ANKLE SURGERY Left    screws to straighten   CHOLECYSTECTOMY     HARDWARE REMOVAL Left 08/03/2023   Procedure: REMOVAL HARDWARE LEFT FOOT;  Surgeon: Timothy Ford, MD;  Location: [facility] OR;  Service: Orthopedics;  Laterality: Left;   KNEE ARTHROSCOPY Right      Current Outpatient Medications:    Accu-Chek Softclix Lancets lancets, Test up to twice daily as directed in the morning fasting and when hypoglycemic, Disp: 100 each, Rfl: 1   acetaminophen  (TYLENOL ) 500 MG tablet, Take 1,000 mg by mouth every 6 (six) hours as needed for headache, moderate pain or mild pain.,  Disp: , Rfl:    amLODipine  (NORVASC ) 10 MG tablet, Take 1 tablet (10 mg total) by mouth daily., Disp: 90 tablet, Rfl: 3   aspirin -acetaminophen -caffeine (EXCEDRIN MIGRAINE) 250-250-65 MG tablet, Take 2 tablets by mouth every 6 (six) hours as needed for headache., Disp: , Rfl:    Blood Glucose Monitoring Suppl (BLOOD GLUCOSE MONITOR SYSTEM) w/Device KIT, Test up to twice daily as directed in the morning fasting and when hypoglycemic, Disp: 1 kit, Rfl: 0   Blood Glucose Monitoring Suppl (CONTOUR NEXT ONE) DEVI,  Use as directed to monitor glucose twice daily., Disp: 1 each, Rfl: 3   celecoxib  (CELEBREX ) 100 MG capsule, Take 1 capsule (100 mg total) by mouth 2 (two) times daily., Disp: 60 capsule, Rfl: 0   diclofenac  (VOLTAREN ) 75 MG EC tablet, Take 1 tablet (75 mg total) by mouth 2 (two) times daily. Do not take with other NSAIDs such as Ibuprofen  or Mobic , Disp: 60 tablet, Rfl: 2   glucose blood (ONETOUCH VERIO) test strip, Use to test glucose up to twice daily as directed in the morning and  as needed for hypoglycemia, Disp: 50 strip, Rfl: 3   ketoconazole  (NIZORAL ) 2 % cream, APPLY 1 CREAM TOPICALLY ONCE DAILY. FACE/SCALP RASH, Disp: 15 g, Rfl: 0   ketoconazole  (NIZORAL ) 2 % shampoo, Apply 1 Application topically 2 (two) times a week. (Patient taking differently: Apply 1 Application topically daily as needed for irritation.), Disp: 120 mL, Rfl: 3   Microlet Lancets MISC, Use as directed to monitor glucose twice daily., Disp: 100 each, Rfl: 1   tirzepatide  (MOUNJARO ) 10 MG/0.5ML Pen, Inject 10 mg into the skin once a week., Disp: 2 mL, Rfl: 2   triamcinolone  cream (KENALOG ) 0.1 %, APPLY CREAM EXTERNALLY TWICE DAILY FOR RASH, Disp: 30 g, Rfl: 0   glipiZIDE  (GLUCOTROL  XL) 5 MG 24 hr tablet, Take 1 tablet (5 mg total) by mouth daily., Disp: 90 tablet, Rfl: 1   losartan -hydrochlorothiazide  (HYZAAR ) 100-25 MG tablet, Take 1 tablet by mouth daily., Disp: 90 tablet, Rfl: 1   metFORMIN  (GLUCOPHAGE ) 1000 MG tablet, Take 1 tablet (1,000 mg total) by mouth 2 (two) times daily., Disp: 180 tablet, Rfl: 1   potassium chloride  (KLOR-CON  M) 10 MEQ tablet, Take 1 tablet (10 mEq total) by mouth 2 (two) times daily., Disp: 180 tablet, Rfl: 1   rosuvastatin  (CRESTOR ) 40 MG tablet, Take 1 tablet (40 mg total) by mouth daily., Disp: 90 tablet, Rfl: 1  Allergies  Allergen Reactions   Nitroglycerin Itching   ROS neg/noncontributory except as noted HPI/below      Objective:      BP 132/84   Pulse 70   Temp (!) 97.5 F  (36.4 C) (Temporal)   Resp 18   Ht 5\' 6"  (1.676 m)   Wt 265 lb 6 oz (120.4 kg)   SpO2 96%   BMI 42.83 kg/m  Wt Readings from Last 3 Encounters:  04/02/24 265 lb 6 oz (120.4 kg)  02/28/24 264 lb (119.7 kg)  02/23/24 264 lb (119.7 kg)    Physical Exam   Gen: WDWN NAD HEENT: NCAT, conjunctiva not injected, sclera nonicteric NECK:  supple, no thyromegaly, no nodes, no carotid bruits CARDIAC: RRR, S1S2+, no murmur. DP 2+B LUNGS: CTAB. No wheezes ABDOMEN:  BS+, soft, NTND, No HSM, no masses EXT:  no edema MSK: no gross abnormalities.  NEURO: A&O x3.  CN II-XII intact.  PSYCH: normal mood. Good eye contact  Diabetic Foot Exam - Simple   Simple  Foot Form Diabetic Foot exam was performed with the following findings: Yes 04/02/2024  8:45 AM  Visual Inspection See comments: Yes Sensation Testing Intact to touch and monofilament testing bilaterally: Yes Pulse Check Posterior Tibialis and Dorsalis pulse intact bilaterally: Yes Comments Bunion, fungal nails        Assessment & Plan:  Hyperlipidemia associated with type 2 diabetes mellitus (HCC) -     Rosuvastatin  Calcium ; Take 1 tablet (40 mg total) by mouth daily.  Dispense: 90 tablet; Refill: 1 -     Comprehensive metabolic panel with GFR -     Lipid panel  Controlled type 2 diabetes mellitus without complication, without long-term current use of insulin  (HCC) -     glipiZIDE  ER; Take 1 tablet (5 mg total) by mouth daily.  Dispense: 90 tablet; Refill: 1 -     metFORMIN  HCl; Take 1 tablet (1,000 mg total) by mouth 2 (two) times daily.  Dispense: 180 tablet; Refill: 1 -     Tirzepatide ; Inject 10 mg into the skin once a week.  Dispense: 2 mL; Refill: 2 -     Comprehensive metabolic panel with GFR -     Hemoglobin A1c -     Vitamin B12  Hypertension associated with diabetes (HCC) -     Losartan  Potassium-HCTZ; Take 1 tablet by mouth daily.  Dispense: 90 tablet; Refill: 1 -     CBC with Differential/Platelet -      Comprehensive metabolic panel with GFR  Other orders -     Potassium Chloride  Crys ER; Take 1 tablet (10 mEq total) by mouth 2 (two) times daily.  Dispense: 180 tablet; Refill: 1  Assessment and Plan Assessment & Plan Type 2 diabetes mellitus with hyperglycemia   Her type 2 diabetes mellitus with hyperglycemia is managed with Mounjaro , metformin , and glipizide . Blood glucose levels fluctuate, with fasting levels around 151 mg/dL and postprandial levels reaching 200 mg/dL. Hemoglobin A1c is 8.3%, indicating suboptimal control. She experiences lightheadedness and dizziness with elevated glucose levels, possibly due to reactive hyperglycemia. Dietary habits include reduced sugar and starch intake, but further adjustments may be needed. She is not engaging in additional exercise due to fatigue from work as a Lawyer. Increasing Mounjaro  and glipizide  may improve glycemic control. Increase glipizide  to 5 mg daily and Mounjaro  to 10 mg weekly. Monitor blood glucose levels closely, especially after fasting and meals. Encourage consumption of a protein-rich breakfast to prevent reactive hyperglycemia. Refill metformin  prescription.  Osteoarthritis of left shoulder   Chronic osteoarthritis of the left shoulder causes persistent pain despite corticosteroid injections and use of Celebrex , Tylenol , and ibuprofen . She reports significant pain, numbness, and occasional locking. An orthopedic consultation recommended surgery, but she is not willing to undergo surgical intervention. Current pain management is insufficient, and surgery is the only option for significant relief. Continue current pain management regimen with Celebrex  and Tylenol . Discuss potential benefits and risks of surgical intervention with her.  Hypertension   Hypertension is well-controlled with amlodipine  and Hyzaar . She has no symptoms of chest pain, shortness of breath, or leg swelling. Ensure adequate supply of amlodipine  and  Hyzaar .  Hyperlipidemia   Hyperlipidemia is managed with rosuvastatin  without reported side effects. Continue rosuvastatin  as prescribed.  Constipation   Intermittent constipation is likely related to Mounjaro  use. She is advised to maintain adequate hydration and consider using a stool softener as needed. Encourage increased fluid intake and consider use of stool softeners as needed.  Visual impairment   Worsening visual acuity,  difficulty reading fine print, and potential cataract development are noted. No recent eye examination has been conducted. Visual changes may be related to diabetes or cataracts. Recommend scheduling an eye examination to assess for diabetic retinopathy and cataracts. Consider prescription for bifocals if needed.    Return in about 3 months (around 07/03/2024) for DM, cholesterol, HTN.  Ellsworth Haas, MD

## 2024-04-02 NOTE — Telephone Encounter (Signed)
 Pharmacy Patient Advocate Encounter   Received notification from Patient Pharmacy that prior authorization for Mounjaro  10 is required/requested.   Insurance verification completed.   The patient is insured through [employer] .   Per test claim: PA required; PA submitted to above mentioned insurance via CoverMyMeds Key/confirmation #/EOC [id number] Status is pending

## 2024-04-02 NOTE — Progress Notes (Signed)
 Rest of labs great.   Continue w/plan that we discussed

## 2024-04-03 ENCOUNTER — Other Ambulatory Visit (HOSPITAL_COMMUNITY): Payer: Self-pay

## 2024-04-03 NOTE — Telephone Encounter (Signed)
 Pharmacy Patient Advocate Encounter  Received notification from [employer] that Prior Authorization for Mounjaro  10MG /0.5ML auto-injectors has been APPROVED from 04/02/24 to 01/08/25. Ran test claim, Copay is $35. This test claim was processed through [facility]- copay amounts may vary at other pharmacies due to pharmacy/plan contracts, or as the patient moves through the different stages of their insurance plan.   PA #/Case ID/Reference #: [id number]

## 2024-04-04 ENCOUNTER — Encounter: Payer: Self-pay | Admitting: *Deleted

## 2024-04-04 ENCOUNTER — Other Ambulatory Visit (HOSPITAL_COMMUNITY): Payer: Self-pay

## 2024-04-04 NOTE — Telephone Encounter (Signed)
 Patient notified of message below.

## 2024-04-08 ENCOUNTER — Other Ambulatory Visit (HOSPITAL_COMMUNITY): Payer: Self-pay

## 2024-04-08 ENCOUNTER — Other Ambulatory Visit: Payer: Self-pay

## 2024-04-22 ENCOUNTER — Other Ambulatory Visit: Payer: Self-pay | Admitting: Surgical

## 2024-04-23 ENCOUNTER — Other Ambulatory Visit: Payer: Self-pay

## 2024-04-23 ENCOUNTER — Other Ambulatory Visit (HOSPITAL_COMMUNITY): Payer: Self-pay

## 2024-04-23 MED ORDER — DICLOFENAC SODIUM 75 MG PO TBEC
75.0000 mg | DELAYED_RELEASE_TABLET | Freq: Two times a day (BID) | ORAL | 2 refills | Status: DC
  Filled 2024-04-23: qty 60, 30d supply, fill #0
  Filled 2024-05-19: qty 60, 30d supply, fill #1
  Filled 2024-06-13: qty 60, 30d supply, fill #2

## 2024-04-25 ENCOUNTER — Other Ambulatory Visit (HOSPITAL_COMMUNITY): Payer: Self-pay

## 2024-05-03 ENCOUNTER — Other Ambulatory Visit (HOSPITAL_COMMUNITY): Payer: Self-pay

## 2024-05-05 ENCOUNTER — Other Ambulatory Visit: Payer: Self-pay | Admitting: Family Medicine

## 2024-05-05 ENCOUNTER — Other Ambulatory Visit (HOSPITAL_COMMUNITY): Payer: Self-pay

## 2024-05-05 ENCOUNTER — Other Ambulatory Visit: Payer: Self-pay

## 2024-05-05 DIAGNOSIS — E119 Type 2 diabetes mellitus without complications: Secondary | ICD-10-CM

## 2024-05-05 MED ORDER — CONTOUR NEXT TEST VI STRP
ORAL_STRIP | 3 refills | Status: AC
  Filled 2024-05-05: qty 50, 25d supply, fill #0
  Filled 2024-05-31: qty 50, 25d supply, fill #1
  Filled 2024-12-12 (×2): qty 50, 25d supply, fill #2

## 2024-05-08 ENCOUNTER — Other Ambulatory Visit (HOSPITAL_COMMUNITY): Payer: Self-pay

## 2024-05-14 LAB — HM DIABETES EYE EXAM

## 2024-05-19 ENCOUNTER — Ambulatory Visit (INDEPENDENT_AMBULATORY_CARE_PROVIDER_SITE_OTHER): Payer: PRIVATE HEALTH INSURANCE | Admitting: Orthopedic Surgery

## 2024-05-19 ENCOUNTER — Other Ambulatory Visit (INDEPENDENT_AMBULATORY_CARE_PROVIDER_SITE_OTHER): Payer: Self-pay

## 2024-05-19 ENCOUNTER — Encounter: Payer: Self-pay | Admitting: Orthopedic Surgery

## 2024-05-19 DIAGNOSIS — M79671 Pain in right foot: Secondary | ICD-10-CM

## 2024-05-19 DIAGNOSIS — M722 Plantar fascial fibromatosis: Secondary | ICD-10-CM

## 2024-05-19 NOTE — Progress Notes (Signed)
 Office Visit Note   Patient: Stephanie Mccall           Date of Birth: Mar 17, 1971           MRN: [id number] Visit Date: 05/19/2024              Requested by: Wendolyn Jenkins Jansky, MD [address] [place],  [place] [address] PCP: Wendolyn Jenkins Jansky, MD  Chief Complaint  Patient presents with   Right Foot - Pain      HPI: Patient is a 53 year old woman who is seen for initial evaluation for a plantar mass right foot.  Patient states she has pain with ambulating barefoot.  Assessment & Plan: Visit Diagnoses:  1. Pain in right foot   2. Plantar fascial fibromatosis     Plan: Recommended continue protective shoe wear recommended trying Voltaren  gel topically 3-4 times a day.  Discussed that if this becomes much larger we could surgically excise the fibromatosis however discussed that there is an increased risk of the fibromatosis reoccurring and a risk of developing painful scar or keloid.  Follow-Up Instructions: Return if symptoms worsen or fail to improve.   Ortho Exam  Patient is alert, oriented, no adenopathy, well-dressed, normal affect, normal respiratory effort. Examination patient has a palpable dorsalis pedis pulse.  She has no pain to palpation along the plantar fascia but she does have a small plantar fibromatosis nodule that is less than 1 cm in diameter.  There is no skin color changes no ulcers.  Most recent hemoglobin A1c 8.0.    Imaging: XR Foot Complete Right Result Date: 05/19/2024 Three-view radiographs of the right foot shows some mild arthritic changes through the midfoot.  No acute fractures.  No images are attached to the encounter.  Labs: Lab Results  Component Value Date   HGBA1C 8.0 (H) 04/02/2024   HGBA1C 8.3 (H) 01/04/2024   HGBA1C 7.4 (H) 07/03/2023     Lab Results  Component Value Date   ALBUMIN 4.5 04/02/2024   ALBUMIN 4.6 07/03/2023   ALBUMIN 4.5 01/09/2023    No results found for: MG Lab Results  Component Value Date   VD25OH  31.72 10/14/2021    No results found for: PREALBUMIN    Latest Ref Rng & Units 04/02/2024    8:53 AM 01/04/2024    4:45 PM 07/03/2023    3:53 PM  CBC EXTENDED  WBC 4.0 - 10.5 K/uL 4.6  5.3  6.4   RBC 3.87 - 5.11 Mil/uL 4.85  4.68  4.87   Hemoglobin 12.0 - 15.0 g/dL 86.6  86.9  86.7   HCT 36.0 - 46.0 % 40.6  39.5  41.7   Platelets 150.0 - 400.0 K/uL 254.0  305  311.0   NEUT# 1.4 - 7.7 K/uL 2.4  2,422  3.7   Lymph# 0.7 - 4.0 K/uL 1.7   2.2      There is no height or weight on file to calculate BMI.  Orders:  Orders Placed This Encounter  Procedures   XR Foot Complete Right   No orders of the defined types were placed in this encounter.    Procedures: No procedures performed  Clinical Data: No additional findings.  ROS:  All other systems negative, except as noted in the HPI. Review of Systems  Objective: Vital Signs: There were no vitals taken for this visit.  Specialty Comments:  No specialty comments available.  PMFS History: Patient Active Problem List   Diagnosis Date Noted  Pain from implanted hardware 08/03/2023   Calcaneal spur of foot, left 08/03/2023   Tendonitis of ankle, left 08/03/2023   Controlled type 2 diabetes mellitus without complication, without long-term current use of insulin  (HCC) 09/29/2021   Hypertension associated with diabetes (HCC) 09/29/2021   Hyperlipidemia associated with type 2 diabetes mellitus (HCC) 09/29/2021   Morbid obesity (HCC) 09/29/2021   Past Medical History:  Diagnosis Date   Allergy    SEASONAL   Arthritis    SHOULDERS   Diabetes mellitus without complication (HCC)    Dysrhythmia    Fibroid    Hyperlipidemia    Hypertension    Sleep apnea     Family History  Problem Relation Age of Onset   Cancer Maternal Aunt    Lung cancer Maternal Aunt    Cancer Maternal Uncle    Lung cancer Maternal Uncle    Cancer Maternal Grandmother    Breast cancer Maternal Grandmother    Colon cancer Neg Hx    Colon  polyps Neg Hx    Crohn's disease Neg Hx    Esophageal cancer Neg Hx    Rectal cancer Neg Hx    Stomach cancer Neg Hx    Ulcerative colitis Neg Hx     Past Surgical History:  Procedure Laterality Date   ABDOMINAL HYSTERECTOMY     partial. fibroids   ANKLE SURGERY Left    screws to straighten   CHOLECYSTECTOMY     HARDWARE REMOVAL Left 08/03/2023   Procedure: REMOVAL HARDWARE LEFT FOOT;  Surgeon: Harden Jerona GAILS, MD;  Location: [facility] OR;  Service: Orthopedics;  Laterality: Left;   KNEE ARTHROSCOPY Right    Social History   Occupational History   Not on file  Tobacco Use   Smoking status: Never    Passive exposure: Past   Smokeless tobacco: Never  Vaping Use   Vaping status: Never Used  Substance and Sexual Activity   Alcohol use: Yes    Comment: occasoinally   Drug use: Never   Sexual activity: Not Currently    Birth control/protection: Surgical

## 2024-05-28 ENCOUNTER — Other Ambulatory Visit (HOSPITAL_COMMUNITY): Payer: Self-pay

## 2024-05-28 ENCOUNTER — Other Ambulatory Visit: Payer: Self-pay

## 2024-06-02 ENCOUNTER — Other Ambulatory Visit (HOSPITAL_COMMUNITY): Payer: Self-pay

## 2024-06-02 ENCOUNTER — Ambulatory Visit (INDEPENDENT_AMBULATORY_CARE_PROVIDER_SITE_OTHER): Payer: PRIVATE HEALTH INSURANCE | Admitting: Podiatry

## 2024-06-02 ENCOUNTER — Encounter: Payer: Self-pay | Admitting: Podiatry

## 2024-06-02 DIAGNOSIS — B351 Tinea unguium: Secondary | ICD-10-CM

## 2024-06-02 NOTE — Progress Notes (Signed)
  Subjective:  Patient ID: Stephanie Mccall, female    DOB: 1971-01-29,   MRN: [id number]  Chief Complaint  Patient presents with   Diabetes    My big toenails are dark and they hurt.  They're powdery underneath the nails.  Saw Dr. Wendolyn - 04/02/2024; A1c - 8.0    53 y.o. female presents for concern of great toenails darkening and discolored with powdery substance' underlying.  Relates burning and tingling in their feet. Patient is diabetic and last A1c was  Lab Results  Component Value Date   HGBA1C 8.0 (H) 04/02/2024   .   PCP:  Wendolyn Jenkins Jansky, MD    . Denies any other pedal complaints. Denies n/v/f/c.   Past Medical History:  Diagnosis Date   Allergy    SEASONAL   Arthritis    SHOULDERS   Diabetes mellitus without complication (HCC)    Dysrhythmia    Fibroid    Hyperlipidemia    Hypertension    Sleep apnea     Objective:  Physical Exam: Vascular: DP/PT pulses 2/4 bilateral. CFT <3 seconds. Normal hair growth on digits. No edema.  Skin. No lacerations or abrasions bilateral feet. Bilateral hallux nails discolored green discoloration on left. Lifted from nail bed with subungual debris Musculoskeletal: MMT 5/5 bilateral lower extremities in DF, PF, Inversion and Eversion. Deceased ROM in DF of ankle joint.  Neurological: Sensation intact to light touch.   Assessment:   1. Onychomycosis      Plan:  Patient was evaluated and treated and all questions answered. -Examined patient -Discussed treatment options for painful dystrophic nails  -Clinical picture and Fungal culture was obtained by removing a portion of the hard nail itself from each of the involved toenails using a sterile nail nipper and sent to [facility] lab. Patient tolerated the biopsy procedure well without discomfort or need for anesthesia.  -Discussed fungal nail treatment options including oral, topical, and laser treatments.  -Patient to return in 4 weeks for follow up evaluation and discussion of fungal  culture results or sooner if symptoms worsen.   Asberry Failing, DPM   3

## 2024-06-02 NOTE — Addendum Note (Signed)
 Addended by: WAYLAN ELIDIA PARAS on: 06/02/2024 11:35 AM   Modules accepted: Orders

## 2024-06-30 ENCOUNTER — Ambulatory Visit (INDEPENDENT_AMBULATORY_CARE_PROVIDER_SITE_OTHER): Payer: PRIVATE HEALTH INSURANCE | Admitting: Podiatry

## 2024-06-30 ENCOUNTER — Other Ambulatory Visit (HOSPITAL_COMMUNITY): Payer: Self-pay

## 2024-06-30 ENCOUNTER — Encounter: Payer: Self-pay | Admitting: Podiatry

## 2024-06-30 DIAGNOSIS — B351 Tinea unguium: Secondary | ICD-10-CM

## 2024-06-30 MED ORDER — TERBINAFINE HCL 250 MG PO TABS
250.0000 mg | ORAL_TABLET | Freq: Every day | ORAL | 0 refills | Status: AC
  Filled 2024-06-30: qty 30, 30d supply, fill #0
  Filled 2024-07-29: qty 30, 30d supply, fill #1
  Filled 2024-09-04: qty 30, 30d supply, fill #2

## 2024-06-30 NOTE — Progress Notes (Signed)
  Subjective:  Patient ID: Stephanie Mccall, female    DOB: December 04, 1970,   MRN: [id number]  Chief Complaint  Patient presents with   Nail Problem    I was here a couple of weeks ago and she did a test.  I'm here for the results.    53 y.o. female presents for follow-up of fungal nails and to discuss culture results.   Relates burning and tingling in their feet. Patient is diabetic and last A1c was  Lab Results  Component Value Date   HGBA1C 8.0 (H) 04/02/2024   .   PCP:  Wendolyn Jenkins Jansky, MD    . Denies any other pedal complaints. Denies n/v/f/c.   Past Medical History:  Diagnosis Date   Allergy    SEASONAL   Arthritis    SHOULDERS   Diabetes mellitus without complication (HCC)    Dysrhythmia    Fibroid    Hyperlipidemia    Hypertension    Sleep apnea     Objective:  Physical Exam: Vascular: DP/PT pulses 2/4 bilateral. CFT <3 seconds. Normal hair growth on digits. No edema.  Skin. No lacerations or abrasions bilateral feet. Bilateral hallux nails discolored green discoloration on left. Lifted from nail bed with subungual debris Musculoskeletal: MMT 5/5 bilateral lower extremities in DF, PF, Inversion and Eversion. Deceased ROM in DF of ankle joint.  Neurological: Sensation intact to light touch.   Assessment:   1. Onychomycosis       Plan:  Patient was evaluated and treated and all questions answered. -Examined patient -Discussed treatment options for painful dystrophic nails  -Culture positive for candida and aspergillus -Discussed fungal nail treatment options including oral, topical, and laser treatments.  -Lamisl sent to the pharmacy. Previous LFTS wnl.  -Patient to return in 3 months.    Asberry Failing, DPM   3

## 2024-07-03 ENCOUNTER — Other Ambulatory Visit (HOSPITAL_COMMUNITY): Payer: Self-pay

## 2024-07-03 ENCOUNTER — Encounter: Payer: Self-pay | Admitting: Family Medicine

## 2024-07-03 ENCOUNTER — Other Ambulatory Visit (HOSPITAL_COMMUNITY)
Admission: RE | Admit: 2024-07-03 | Discharge: 2024-07-03 | Disposition: A | Discharge status: Home / Self Care | Payer: PRIVATE HEALTH INSURANCE | Source: Ambulatory Visit | Attending: Family Medicine | Admitting: Family Medicine

## 2024-07-03 ENCOUNTER — Ambulatory Visit (INDEPENDENT_AMBULATORY_CARE_PROVIDER_SITE_OTHER): Payer: PRIVATE HEALTH INSURANCE | Admitting: Family Medicine

## 2024-07-03 VITALS — BP 138/82 | HR 62 | Temp 97.2°F | Ht 66.0 in | Wt 261.0 lb

## 2024-07-03 DIAGNOSIS — E119 Type 2 diabetes mellitus without complications: Secondary | ICD-10-CM

## 2024-07-03 DIAGNOSIS — N9489 Other specified conditions associated with female genital organs and menstrual cycle: Secondary | ICD-10-CM | POA: Diagnosis not present

## 2024-07-03 DIAGNOSIS — Z7985 Long-term (current) use of injectable non-insulin antidiabetic drugs: Secondary | ICD-10-CM

## 2024-07-03 DIAGNOSIS — R300 Dysuria: Secondary | ICD-10-CM | POA: Diagnosis not present

## 2024-07-03 LAB — POC URINALSYSI DIPSTICK (AUTOMATED)
Bilirubin, UA: NEGATIVE
Blood, UA: NEGATIVE
Glucose, UA: NEGATIVE
Ketones, UA: NEGATIVE
Leukocytes, UA: NEGATIVE
Nitrite, UA: NEGATIVE
Protein, UA: NEGATIVE
Spec Grav, UA: 1.015 (ref 1.010–1.025)
Urobilinogen, UA: 0.2 U/dL
pH, UA: 6 (ref 5.0–8.0)

## 2024-07-03 LAB — POCT GLYCOSYLATED HEMOGLOBIN (HGB A1C): Hemoglobin A1C: 7.7 % — AB (ref 4.0–5.6)

## 2024-07-03 MED ORDER — MOUNJARO 12.5 MG/0.5ML ~~LOC~~ SOAJ
12.5000 mg | SUBCUTANEOUS | 2 refills | Status: DC
  Filled 2024-07-03 – 2024-07-31 (×10): qty 2, 28d supply, fill #0
  Filled 2024-08-25: qty 2, 28d supply, fill #1
  Filled 2024-09-26: qty 2, 28d supply, fill #2

## 2024-07-03 MED ORDER — FLUCONAZOLE 150 MG PO TABS
150.0000 mg | ORAL_TABLET | Freq: Once | ORAL | 0 refills | Status: AC
  Filled 2024-07-03: qty 1, 1d supply, fill #0

## 2024-07-03 MED ORDER — KETOCONAZOLE 2 % EX CREA
1.0000 | TOPICAL_CREAM | Freq: Two times a day (BID) | CUTANEOUS | 1 refills | Status: AC
  Filled 2024-07-03: qty 30, 15d supply, fill #0
  Filled 2024-07-29: qty 30, 15d supply, fill #1

## 2024-07-03 MED ORDER — ESTRADIOL 0.1 MG/GM VA CREA
1.0000 | TOPICAL_CREAM | VAGINAL | 1 refills | Status: AC
  Filled 2024-07-03: qty 42.5, 30d supply, fill #0
  Filled 2024-07-29: qty 42.5, 30d supply, fill #1

## 2024-07-03 NOTE — Patient Instructions (Signed)

## 2024-07-03 NOTE — Progress Notes (Signed)
 Subjective:     Patient ID: Stephanie Mccall, female    DOB: 1971-02-15, 53 y.o.   MRN: [id number]  Chief Complaint  Patient presents with   Diabetes   Hypertension   Hyperlipidemia   Medical Management of Chronic Issues    Bruning with urination x1 month; tried monistat with no relief; there is also a boil on her vagina;     HPI Discussed the use of AI scribe software for clinical note transcription with the patient, who gave verbal consent to proceed.  History of Present Illness Stephanie Mccall is a 53 year old female with type 2 diabetes who presents with vaginal burning and shoulder pain.  She has been experiencing a burning sensation in the vaginal area for the past month. Initially, this was accompanied by sharp pain during urination, described as 'like a needle,' but this has improved. She used a three-night course of Monistat, which did not alleviate the symptoms. The burning persists, although the frequency of urination has decreased, and she no longer experiences itching. She noticed a boil in the area two weeks ago, which resolved, but another appeared recently. She shaves the area occasionally, which she suspects may contribute to the boils. No fever, chills, or unusual odor are present.  She has type 2 diabetes and is currently on Mounjaro  10 mg once weekly, metformin  1000 mg twice daily, and glipizide  5 mg once daily. Her blood sugar levels are generally between 90 and 150 mg/dL, with no episodes of hypoglycemia. She experiences constipation as a side effect of her diabetes medication but denies nausea. She works long hours, which limits her ability to exercise.  She also reports chronic shoulder pain for which she has seen an orthopedic specialist. She has received injections that provided minimal relief and has not undergone physical therapy as did in past. . She takes Tylenol , ibuprofen ,  for pain, but the pain persists, especially after work. Was told she needs surgery, but  can't right now    There are no preventive care reminders to display for this patient.  Past Medical History:  Diagnosis Date   Allergy    SEASONAL   Arthritis    SHOULDERS   Diabetes mellitus without complication (HCC)    Dysrhythmia    Fibroid    Hyperlipidemia    Hypertension    Sleep apnea     Past Surgical History:  Procedure Laterality Date   ABDOMINAL HYSTERECTOMY     partial. fibroids   ANKLE SURGERY Left    screws to straighten   CHOLECYSTECTOMY     HARDWARE REMOVAL Left 08/03/2023   Procedure: REMOVAL HARDWARE LEFT FOOT;  Surgeon: Harden Jerona GAILS, MD;  Location: [facility] OR;  Service: Orthopedics;  Laterality: Left;   KNEE ARTHROSCOPY Right      Current Outpatient Medications:    Accu-Chek Softclix Lancets lancets, Test up to twice daily as directed in the morning fasting and when hypoglycemic, Disp: 100 each, Rfl: 1   acetaminophen  (TYLENOL ) 500 MG tablet, Take 1,000 mg by mouth every 6 (six) hours as needed for headache, moderate pain or mild pain., Disp: , Rfl:    amLODipine  (NORVASC ) 10 MG tablet, Take 1 tablet (10 mg total) by mouth daily., Disp: 90 tablet, Rfl: 3   aspirin -acetaminophen -caffeine (EXCEDRIN MIGRAINE) 250-250-65 MG tablet, Take 2 tablets by mouth every 6 (six) hours as needed for headache., Disp: , Rfl:    Blood Glucose Monitoring Suppl (BLOOD GLUCOSE MONITOR SYSTEM) w/Device KIT, Test up to twice daily  as directed in the morning fasting and when hypoglycemic, Disp: 1 kit, Rfl: 0   Blood Glucose Monitoring Suppl (CONTOUR NEXT ONE) DEVI, Use as directed to monitor glucose twice daily., Disp: 1 each, Rfl: 3   diclofenac  (VOLTAREN ) 75 MG EC tablet, Take 1 tablet (75 mg total) by mouth 2 (two) times daily. Do not take with other NSAIDs such as Ibuprofen  or Mobic , Disp: 60 tablet, Rfl: 2   estradiol  (ESTRACE ) 0.1 MG/GM vaginal cream, Place 1 Applicatorful vaginally daily at bedtime for 2 weeks, then 2 (two) times a week thereafter,, Disp: 42.5 g, Rfl: 1    fluconazole  (DIFLUCAN ) 150 MG tablet, Take 1 tablet (150 mg total) by mouth once for 1 dose., Disp: 1 tablet, Rfl: 0   glipiZIDE  (GLUCOTROL  XL) 5 MG 24 hr tablet, Take 1 tablet (5 mg total) by mouth daily., Disp: 90 tablet, Rfl: 1   glucose blood (CONTOUR NEXT TEST) test strip, Use to test glucose up to twice daily as directed in the morning and as needed for hypoglycemia, Disp: 50 strip, Rfl: 3   ketoconazole  (NIZORAL ) 2 % shampoo, Apply 1 Application topically 2 (two) times a week. (Patient taking differently: Apply 1 Application topically daily as needed for irritation.), Disp: 120 mL, Rfl: 3   losartan -hydrochlorothiazide  (HYZAAR ) 100-25 MG tablet, Take 1 tablet by mouth daily., Disp: 90 tablet, Rfl: 1   metFORMIN  (GLUCOPHAGE ) 1000 MG tablet, Take 1 tablet (1,000 mg total) by mouth 2 (two) times daily., Disp: 180 tablet, Rfl: 1   Microlet Lancets MISC, Use as directed to monitor glucose twice daily., Disp: 100 each, Rfl: 1   potassium chloride  (KLOR-CON  M) 10 MEQ tablet, Take 1 tablet (10 mEq total) by mouth 2 (two) times daily., Disp: 180 tablet, Rfl: 1   rosuvastatin  (CRESTOR ) 40 MG tablet, Take 1 tablet (40 mg total) by mouth daily., Disp: 90 tablet, Rfl: 1   terbinafine  (LAMISIL ) 250 MG tablet, Take 1 tablet (250 mg total) by mouth daily., Disp: 90 tablet, Rfl: 0   tirzepatide  (MOUNJARO ) 12.5 MG/0.5ML Pen, Inject 12.5 mg into the skin once a week., Disp: 2 mL, Rfl: 2   triamcinolone  cream (KENALOG ) 0.1 %, APPLY CREAM EXTERNALLY TWICE DAILY FOR RASH, Disp: 30 g, Rfl: 0   ketoconazole  (NIZORAL ) 2 % cream, Apply 1 Application topically 2 (two) times daily., Disp: 30 g, Rfl: 1  Allergies  Allergen Reactions   Nitroglycerin Itching   ROS neg/noncontributory except as noted HPI/below      Objective:     BP 138/82 (BP Location: Right Arm, Patient Position: Sitting, Cuff Size: Normal)   Pulse 62   Temp (!) 97.2 F (36.2 C) (Temporal)   Ht 5' 6 (1.676 m)   Wt 261 lb (118.4 kg)   SpO2  95%   BMI 42.13 kg/m  Wt Readings from Last 3 Encounters:  07/03/24 261 lb (118.4 kg)  04/02/24 265 lb 6 oz (120.4 kg)  02/28/24 264 lb (119.7 kg)    Physical Exam   Gen: WDWN NAD HEENT: NCAT, conjunctiva not injected, sclera nonicteric NECK:  supple, no thyromegaly, no nodes, no carotid bruits CARDIAC: RRR, S1S2+, no murmur. DP 2+B LUNGS: CTAB. No wheezes ABDOMEN:  BS+, soft, NTND, No HSM, no masses EXT:  no edema MSK: no gross abnormalities.  NEURO: A&O x3.  CN II-XII intact.  PSYCH: normal mood. Good eye contact  Genital-TC present for chaperone-no external lesions. No unusual d/b.  Cvx present and not inflammed.  Some dryness.   Results  for orders placed or performed in visit on 07/03/24  POCT Urinalysis Dipstick (Automated)   Collection Time: 07/03/24  9:12 AM  Result Value Ref Range   Color, UA yellow    Clarity, UA clear    Glucose, UA Negative Negative   Bilirubin, UA Negative    Ketones, UA Negative    Spec Grav, UA 1.015 1.010 - 1.025   Blood, UA Negative    pH, UA 6.0 5.0 - 8.0   Protein, UA Negative Negative   Urobilinogen, UA 0.2 0.2 or 1.0 E.U./dL   Nitrite, UA Negative    Leukocytes, UA Negative Negative  POCT HgB A1C   Collection Time: 07/03/24  9:21 AM  Result Value Ref Range   Hemoglobin A1C 7.7 (A) 4.0 - 5.6 %   HbA1c POC (<> result, manual entry)     HbA1c, POC (prediabetic range)     HbA1c, POC (controlled diabetic range)          Assessment & Plan:  Dysuria -     POCT Urinalysis Dipstick (Automated) -     Urine Culture  Controlled type 2 diabetes mellitus without complication, without long-term current use of insulin  (HCC) -     POCT glycosylated hemoglobin (Hb A1C) -     Mounjaro ; Inject 12.5 mg into the skin once a week.  Dispense: 2 mL; Refill: 2  Vaginal burning -     Cervicovaginal ancillary only  Other orders -     Ketoconazole ; Apply 1 Application topically 2 (two) times daily.  Dispense: 30 g; Refill: 1 -     Fluconazole ;  Take 1 tablet (150 mg total) by mouth once for 1 dose.  Dispense: 1 tablet; Refill: 0 -     Estradiol ; Place 1 Applicatorful vaginally daily at bedtime for 2 weeks, then 2 (two) times a week thereafter,  Dispense: 42.5 g; Refill: 1  Assessment and Plan Assessment & Plan Vaginal burning and dysuria (suspected yeast vaginitis and atrophic vaginitis)   She has experienced persistent vaginal burning for a month, with initial dysuria that has improved. Urinalysis was negative. Differential diagnosis includes yeast vaginitis, bacterial infection, atrophic vaginitis, and UTI. She is at high risk for yeast vaginitis due to suboptimal control of type 2 diabetes mellitus. Prescribe Diflucan  150 mg once. Check Aptima swab. Start Estrace  vaginal cream, one applicator per vagina QHS for 14 days, then two times weekly. Advise that improvement may take several months.  Type 2 diabetes mellitus   Her A1c has improved to 7.7%. Blood sugars are generally well-controlled, ranging from 90 to 150 mg/dL. Current medications include Mounjaro  10 mg weekly, metformin  1000 mg BID, and glipizide  5 mg daily. She is working harder on her diet. Constipation is noted as a side effect of Mounjaro . Continue glipizide  5 mg daily and metformin  1000 mg BID. Increase Mounjaro  to 12.5 mg subcutaneously weekly. Follow up in three months.  Chronic right shoulder pain   She has chronic right shoulder pain with previous injections providing no lasting relief. No physical therapy has been attempted. Surgery has been recommended by orthopedics but she is not ready for it. Pain is managed with Tylenol , ibuprofen ,  allowing her to work. F/u ortho.    Return in about 3 months (around 10/03/2024) for chronic follow-up.  Jenkins CHRISTELLA Carrel, MD

## 2024-07-04 ENCOUNTER — Other Ambulatory Visit (HOSPITAL_COMMUNITY): Payer: Self-pay

## 2024-07-04 ENCOUNTER — Telehealth: Payer: Self-pay

## 2024-07-04 LAB — CERVICOVAGINAL ANCILLARY ONLY
Bacterial Vaginitis (gardnerella): NEGATIVE
Candida Glabrata: NEGATIVE
Candida Vaginitis: NEGATIVE
Chlamydia: NEGATIVE
Comment: NEGATIVE
Comment: NEGATIVE
Comment: NEGATIVE
Comment: NEGATIVE
Comment: NEGATIVE
Comment: NORMAL
Neisseria Gonorrhea: NEGATIVE
Trichomonas: NEGATIVE

## 2024-07-04 NOTE — Telephone Encounter (Signed)
 Pharmacy Patient Advocate Encounter   Received notification from Onbase that prior authorization for Mounjaro  12.5MG /0.5ML auto-injectors is required/requested.   Insurance verification completed.   The patient is insured through [employer] .   Per test claim: PA required; PA submitted to above mentioned insurance via Latent Key/confirmation #/EOC [id number] Status is pending

## 2024-07-06 ENCOUNTER — Ambulatory Visit: Payer: Self-pay | Admitting: Family Medicine

## 2024-07-06 LAB — URINE CULTURE
MICRO NUMBER:: 16864806
SPECIMEN QUALITY:: ADEQUATE

## 2024-07-06 NOTE — Progress Notes (Signed)
 Does have urine infectrion.  Send in macrobid  100mg  bid x 7 days

## 2024-07-07 ENCOUNTER — Other Ambulatory Visit: Payer: Self-pay | Admitting: *Deleted

## 2024-07-07 ENCOUNTER — Other Ambulatory Visit (HOSPITAL_COMMUNITY): Payer: Self-pay

## 2024-07-07 MED ORDER — NITROFURANTOIN MONOHYD MACRO 100 MG PO CAPS
100.0000 mg | ORAL_CAPSULE | Freq: Two times a day (BID) | ORAL | 0 refills | Status: AC
  Filled 2024-07-07: qty 14, 7d supply, fill #0

## 2024-07-08 NOTE — Telephone Encounter (Signed)
 Pharmacy Patient Advocate Encounter  Received notification from [facility] [employer] that Prior Authorization for MOUNJARO   has been APPROVED from 01/09/24 to 01/08/25   PA #/Case ID/Reference #: [id number]

## 2024-07-09 ENCOUNTER — Other Ambulatory Visit (HOSPITAL_COMMUNITY): Payer: Self-pay

## 2024-07-15 ENCOUNTER — Other Ambulatory Visit: Payer: Self-pay

## 2024-07-15 ENCOUNTER — Other Ambulatory Visit (HOSPITAL_COMMUNITY): Payer: Self-pay

## 2024-07-15 ENCOUNTER — Other Ambulatory Visit: Payer: Self-pay | Admitting: Surgical

## 2024-07-15 MED ORDER — DICLOFENAC SODIUM 75 MG PO TBEC
75.0000 mg | DELAYED_RELEASE_TABLET | Freq: Two times a day (BID) | ORAL | 2 refills | Status: DC
  Filled 2024-07-15: qty 60, 30d supply, fill #0
  Filled 2024-08-17: qty 60, 30d supply, fill #1
  Filled 2024-08-25 – 2024-09-14 (×2): qty 60, 30d supply, fill #2

## 2024-07-25 ENCOUNTER — Other Ambulatory Visit (HOSPITAL_COMMUNITY): Payer: Self-pay

## 2024-07-28 ENCOUNTER — Telehealth: Payer: Self-pay

## 2024-07-28 NOTE — Telephone Encounter (Signed)
 I called and spoke with pt in regards to Approval. I let pt know to contact pharmacy and let them know so she's able to pick up, pt verbalized understanding.   Copied from CRM #[id number]. Topic: Clinical - Prescription Issue >> Jul 25, 2024  4:13 PM Tysheama G wrote: Reason for CRM: Patient wants to know if doctor put the refill in for her medication Mounjaro  12.5MG /0.5ML. Callback number [phone]

## 2024-07-29 ENCOUNTER — Telehealth: Payer: Self-pay | Admitting: *Deleted

## 2024-07-29 ENCOUNTER — Other Ambulatory Visit (HOSPITAL_COMMUNITY): Payer: Self-pay

## 2024-07-29 ENCOUNTER — Other Ambulatory Visit (HOSPITAL_BASED_OUTPATIENT_CLINIC_OR_DEPARTMENT_OTHER): Payer: Self-pay

## 2024-07-29 ENCOUNTER — Other Ambulatory Visit: Payer: Self-pay

## 2024-07-29 NOTE — Telephone Encounter (Signed)
 Copied from CRM [id number]. Topic: Clinical - Medication Prior Auth >> Jul 29, 2024  1:59 PM Mesmerise C wrote: Reason for CRM: Patient stated pharmacy doesn't have approval for mounjaro  for PA  Spoke to pharmacy and they stated that insurance is still saying PA needed.

## 2024-07-30 ENCOUNTER — Other Ambulatory Visit (HOSPITAL_COMMUNITY): Payer: Self-pay

## 2024-07-30 NOTE — Telephone Encounter (Signed)
 Patient notified of message below.

## 2024-07-31 ENCOUNTER — Other Ambulatory Visit (HOSPITAL_COMMUNITY): Payer: Self-pay

## 2024-08-01 ENCOUNTER — Other Ambulatory Visit (HOSPITAL_COMMUNITY): Payer: Self-pay

## 2024-08-25 ENCOUNTER — Other Ambulatory Visit (HOSPITAL_COMMUNITY): Payer: Self-pay

## 2024-08-25 ENCOUNTER — Other Ambulatory Visit: Payer: Self-pay

## 2024-09-15 ENCOUNTER — Encounter: Payer: Self-pay | Admitting: Radiology

## 2024-10-01 ENCOUNTER — Encounter: Payer: Self-pay | Admitting: Podiatry

## 2024-10-01 ENCOUNTER — Ambulatory Visit (INDEPENDENT_AMBULATORY_CARE_PROVIDER_SITE_OTHER): Payer: PRIVATE HEALTH INSURANCE | Admitting: Podiatry

## 2024-10-01 DIAGNOSIS — B351 Tinea unguium: Secondary | ICD-10-CM

## 2024-10-01 NOTE — Progress Notes (Signed)
  Subjective:  Patient ID: Stephanie Mccall, female    DOB: 04-25-1971,   MRN: [id number]  Chief Complaint  Patient presents with   Nail Problem    They're still dark but the pain is not as much as before.    53 y.o. female presents for follow-up of fungal nails.  She relates she is still currently taking Lamisil  and has few more days left.  She relates there is improvement in the nails and they are not as painful as they were before.  No issues with taking her Lamisil .  Relates burning and tingling in their feet. Patient is diabetic and last A1c was  Lab Results  Component Value Date   HGBA1C 7.7 (A) 07/03/2024   .   PCP:  Wendolyn Jenkins Jansky, MD    . Denies any other pedal complaints. Denies n/v/f/c.   Past Medical History:  Diagnosis Date   Allergy    SEASONAL   Arthritis    SHOULDERS   Diabetes mellitus without complication (HCC)    Dysrhythmia    Fibroid    Hyperlipidemia    Hypertension    Sleep apnea     Objective:  Physical Exam: Vascular: DP/PT pulses 2/4 bilateral. CFT <3 seconds. Normal hair growth on digits. No edema.  Skin. No lacerations or abrasions bilateral feet. Bilateral hallux nails discolored green discoloration on left.  Nails are to be improving with less subungual debris and clearing noted in the proximal nail plate. Musculoskeletal: MMT 5/5 bilateral lower extremities in DF, PF, Inversion and Eversion. Deceased ROM in DF of ankle joint.  Neurological: Sensation intact to light touch.   Assessment:   1. Onychomycosis        Plan:  Patient was evaluated and treated and all questions answered. -Examined patient -Discussed treatment options for painful dystrophic nails  -Culture positive for candida and aspergillus -Discussed fungal nail treatment options including oral, topical, and laser treatments.  -Finish out the course of Lamisil .  Discussed likely this will be sufficient to cure her problem.  Did advise if any worsening or plateauing of the  symptoms to return. -Patient to return as needed   Asberry Failing, DPM

## 2024-10-02 ENCOUNTER — Emergency Department (HOSPITAL_BASED_OUTPATIENT_CLINIC_OR_DEPARTMENT_OTHER): Payer: PRIVATE HEALTH INSURANCE | Admitting: Radiology

## 2024-10-02 ENCOUNTER — Other Ambulatory Visit: Payer: Self-pay

## 2024-10-02 ENCOUNTER — Emergency Department (HOSPITAL_BASED_OUTPATIENT_CLINIC_OR_DEPARTMENT_OTHER)
Admission: EM | Admit: 2024-10-02 | Discharge: 2024-10-02 | Disposition: A | Discharge status: Home / Self Care | Payer: PRIVATE HEALTH INSURANCE | Attending: Emergency Medicine | Admitting: Emergency Medicine

## 2024-10-02 DIAGNOSIS — W010XXA Fall on same level from slipping, tripping and stumbling without subsequent striking against object, initial encounter: Secondary | ICD-10-CM | POA: Insufficient documentation

## 2024-10-02 DIAGNOSIS — Z7982 Long term (current) use of aspirin: Secondary | ICD-10-CM | POA: Diagnosis not present

## 2024-10-02 DIAGNOSIS — S20211A Contusion of right front wall of thorax, initial encounter: Secondary | ICD-10-CM | POA: Insufficient documentation

## 2024-10-02 DIAGNOSIS — S299XXA Unspecified injury of thorax, initial encounter: Secondary | ICD-10-CM | POA: Diagnosis present

## 2024-10-02 DIAGNOSIS — S298XXA Other specified injuries of thorax, initial encounter: Secondary | ICD-10-CM

## 2024-10-02 NOTE — ED Triage Notes (Signed)
 Fall at work on Tuesday  Trip/slip fall Pain on right side Chest/ribs

## 2024-10-02 NOTE — ED Notes (Signed)
 Reviewed AVS/discharge instructions with patient. Time allotted for and all questions answered. Patient is agreeable for d/c and escorted to ED exit by staff.

## 2024-10-02 NOTE — ED Provider Notes (Signed)
 [place] EMERGENCY DEPARTMENT AT [facility] Provider Note   CSN: [id number] Arrival date & time: 10/02/24  1614     Patient presents with: Stephanie Mccall   Stephanie Mccall is a 53 y.o. female.  Who presents to ED after fall.  Patient fell 2 nights ago while at work.  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  Has had right chest wall pain since then made worse with coughing or laughing.  No shortness of breath.  No other injuries.  No head trauma or loss consciousness.    Fall       Prior to Admission medications   Medication Sig Start Date End Date Taking? Authorizing Provider  Accu-Chek Softclix Lancets lancets Test up to twice daily as directed in the morning fasting and when hypoglycemic 10/27/21     acetaminophen  (TYLENOL ) 500 MG tablet Take 1,000 mg by mouth every 6 (six) hours as needed for headache, moderate pain or mild pain.    [provider]  amLODipine  (NORVASC ) 10 MG tablet Take 1 tablet (10 mg total) by mouth daily. 01/04/24   Wendolyn Jenkins Jansky, MD  aspirin -acetaminophen -caffeine (EXCEDRIN MIGRAINE) 250-250-65 MG tablet Take 2 tablets by mouth every 6 (six) hours as needed for headache.    [provider]  Blood Glucose Monitoring Suppl (BLOOD GLUCOSE MONITOR SYSTEM) w/Device KIT Test up to twice daily as directed in the morning fasting and when hypoglycemic 10/27/21     Blood Glucose Monitoring Suppl (CONTOUR NEXT ONE) DEVI Use as directed to monitor glucose twice daily. 01/04/24   Wendolyn Jenkins Jansky, MD  diclofenac  (VOLTAREN ) 75 MG EC tablet Take 1 tablet (75 mg total) by mouth 2 (two) times daily. Do not take with other NSAIDs such as Ibuprofen  or Mobic  07/15/24   Addie Cordella Hamilton, MD  estradiol  (ESTRACE ) 0.1 MG/GM vaginal cream Place 1 Applicatorful vaginally daily at bedtime for 2 weeks, then 2 (two) times a week thereafter, 07/03/24   Wendolyn Jenkins Jansky, MD  glipiZIDE  (GLUCOTROL  XL) 5 MG 24 hr tablet Take 1 tablet (5 mg  total) by mouth daily. 04/02/24   Wendolyn Jenkins Jansky, MD  glucose blood (CONTOUR NEXT TEST) test strip Use to test glucose up to twice daily as directed in the morning and as needed for hypoglycemia 05/05/24   Wendolyn Jenkins Jansky, MD  ketoconazole  (NIZORAL ) 2 % cream Apply 1 Application topically 2 (two) times daily. 07/03/24   Wendolyn Jenkins Jansky, MD  ketoconazole  (NIZORAL ) 2 % shampoo Apply 1 Application topically 2 (two) times a week. Patient taking differently: Apply 1 Application topically daily as needed for irritation. 06/04/23   Wendolyn Jenkins Jansky, MD  losartan -hydrochlorothiazide  (HYZAAR ) 100-25 MG tablet Take 1 tablet by mouth daily. 04/02/24   Wendolyn Jenkins Jansky, MD  metFORMIN  (GLUCOPHAGE ) 1000 MG tablet Take 1 tablet (1,000 mg total) by mouth 2 (two) times daily. 04/02/24   Wendolyn Jenkins Jansky, MD  Microlet Lancets MISC Use as directed to monitor glucose twice daily. 01/04/24   Wendolyn Jenkins Jansky, MD  potassium chloride  (KLOR-CON  M) 10 MEQ tablet Take 1 tablet (10 mEq total) by mouth 2 (two) times daily. 04/02/24   Wendolyn Jenkins Jansky, MD  rosuvastatin  (CRESTOR ) 40 MG tablet Take 1 tablet (40 mg total) by mouth daily. 04/02/24   Wendolyn Jenkins Jansky, MD  terbinafine  (LAMISIL ) 250 MG tablet Take 1 tablet (250 mg total) by mouth daily. 06/30/24 10/08/24  Sikora, Rebecca, DPM  tirzepatide  (MOUNJARO ) 12.5 MG/0.5ML Pen Inject 12.5  mg into the skin once a week. 07/03/24   Wendolyn Jenkins Jansky, MD  triamcinolone  cream (KENALOG ) 0.1 % APPLY CREAM EXTERNALLY TWICE DAILY FOR RASH 03/11/24   Wendolyn Jenkins Jansky, MD  diltiazem  (DILACOR XR ) 120 MG 24 hr capsule Take 1 capsule (120 mg total) by mouth daily. Take along with 240mg  dose to equal 360mg  daily 10/13/21 10/13/21      Allergies: Nitroglycerin    Review of Systems  Updated Vital Signs BP 136/60   Pulse 60   Temp 98.4 F (36.9 C)   Resp 18   SpO2 99%   Physical Exam Vitals and nursing note reviewed.  HENT:     Head: Normocephalic and atraumatic.  Cardiovascular:     Rate  and Rhythm: Normal rate and regular rhythm.  Pulmonary:     Effort: Pulmonary effort is normal.  Abdominal:     General: There is no distension.  Musculoskeletal:     Comments: Right lateral chest wall tenderness to palpation without deformity flail chest  Neurological:     Mental Status: She is alert.  Psychiatric:        Mood and Affect: Mood normal.     (all labs ordered are listed, but only abnormal results are displayed) Labs Reviewed - No data to display  EKG: None  Radiology: DG Ribs Unilateral W/Chest Right Result Date: 10/02/2024 CLINICAL DATA:  Status post fall 2 days ago with subsequent right-sided rib pain. EXAM: RIGHT RIBS AND CHEST - 3+ VIEW COMPARISON:  None Available. FINDINGS: A radiopaque marker was placed at the site of the patient's pain. No fracture or other bone lesions are seen involving the ribs. There is no evidence of pneumothorax or pleural effusion. Both lungs are clear. Heart size and mediastinal contours are within normal limits. IMPRESSION: Negative. Electronically Signed   By: Suzen Dials M.D.   On: 10/02/2024 18:26     Procedures   Medications Ordered in the ED - No data to display                                  Medical Decision Making 53 year old female presenting after a fall at work 2 nights ago.  Right lateral chest wall pain.  No evidence of rib fracture on x-ray.  No pneumothorax.  Stable on room air without respiratory distress most likely rib contusion.  Counseled her on symptomatic management.  Appropriate for discharge  Amount and/or Complexity of Data Reviewed Radiology: ordered.        Final diagnoses:  Contusion of rib, initial encounter    ED Discharge Orders     None          Stephanie Ozell LABOR, DO 10/02/24 2037

## 2024-10-02 NOTE — Discharge Instructions (Signed)
 You were seen in the emergency room for injuries after fall There are no broken ribs on the right but you likely have a bruised rib For this she should take Tylenol  Motrin  and apply ice Return to the emerged department for severe pain or trouble breathing

## 2024-10-03 ENCOUNTER — Encounter: Payer: Self-pay | Admitting: Family Medicine

## 2024-10-03 ENCOUNTER — Other Ambulatory Visit (HOSPITAL_COMMUNITY): Payer: Self-pay

## 2024-10-03 ENCOUNTER — Ambulatory Visit: Payer: PRIVATE HEALTH INSURANCE | Admitting: Family Medicine

## 2024-10-03 ENCOUNTER — Ambulatory Visit: Payer: Self-pay | Admitting: Family Medicine

## 2024-10-03 VITALS — BP 116/68 | HR 60 | Temp 97.7°F | Ht 66.0 in | Wt 268.0 lb

## 2024-10-03 DIAGNOSIS — Z7984 Long term (current) use of oral hypoglycemic drugs: Secondary | ICD-10-CM

## 2024-10-03 DIAGNOSIS — S298XXD Other specified injuries of thorax, subsequent encounter: Secondary | ICD-10-CM | POA: Diagnosis not present

## 2024-10-03 DIAGNOSIS — E785 Hyperlipidemia, unspecified: Secondary | ICD-10-CM

## 2024-10-03 DIAGNOSIS — E1159 Type 2 diabetes mellitus with other circulatory complications: Secondary | ICD-10-CM | POA: Diagnosis not present

## 2024-10-03 DIAGNOSIS — G4489 Other headache syndrome: Secondary | ICD-10-CM

## 2024-10-03 DIAGNOSIS — R002 Palpitations: Secondary | ICD-10-CM | POA: Diagnosis not present

## 2024-10-03 DIAGNOSIS — E1169 Type 2 diabetes mellitus with other specified complication: Secondary | ICD-10-CM | POA: Diagnosis not present

## 2024-10-03 DIAGNOSIS — I152 Hypertension secondary to endocrine disorders: Secondary | ICD-10-CM

## 2024-10-03 DIAGNOSIS — E119 Type 2 diabetes mellitus without complications: Secondary | ICD-10-CM

## 2024-10-03 LAB — COMPREHENSIVE METABOLIC PANEL WITH GFR
ALT: 30 U/L (ref 0–35)
AST: 26 U/L (ref 0–37)
Albumin: 4.6 g/dL (ref 3.5–5.2)
Alkaline Phosphatase: 81 U/L (ref 39–117)
BUN: 17 mg/dL (ref 6–23)
CO2: 26 meq/L (ref 19–32)
Calcium: 9.5 mg/dL (ref 8.4–10.5)
Chloride: 101 meq/L (ref 96–112)
Creatinine, Ser: 0.7 mg/dL (ref 0.40–1.20)
GFR: 97.97 mL/min (ref >60.00)
Glucose, Bld: 142 mg/dL — ABNORMAL HIGH (ref 70–99)
Potassium: 3.5 meq/L (ref 3.5–5.1)
Sodium: 137 meq/L (ref 135–145)
Total Bilirubin: 0.5 mg/dL (ref 0.2–1.2)
Total Protein: 8.2 g/dL (ref 6.0–8.3)

## 2024-10-03 LAB — CBC WITH DIFFERENTIAL/PLATELET
Basophils Absolute: 0 K/uL (ref 0.0–0.1)
Basophils Relative: 0.5 % (ref 0.0–3.0)
Eosinophils Absolute: 0.1 K/uL (ref 0.0–0.7)
Eosinophils Relative: 2.8 % (ref 0.0–5.0)
HCT: 39.5 % (ref 36.0–46.0)
Hemoglobin: 13.1 g/dL (ref 12.0–15.0)
Lymphocytes Relative: 35.4 % (ref 12.0–46.0)
Lymphs Abs: 1.5 K/uL (ref 0.7–4.0)
MCHC: 33.1 g/dL (ref 30.0–36.0)
MCV: 83 fl (ref 78.0–100.0)
Monocytes Absolute: 0.3 K/uL (ref 0.1–1.0)
Monocytes Relative: 6.6 % (ref 3.0–12.0)
Neutro Abs: 2.3 K/uL (ref 1.4–7.7)
Neutrophils Relative %: 54.7 % (ref 43.0–77.0)
Platelets: 265 K/uL (ref 150.0–400.0)
RBC: 4.76 Mil/uL (ref 3.87–5.11)
RDW: 14.6 % (ref 11.5–15.5)
WBC: 4.2 K/uL (ref 4.0–10.5)

## 2024-10-03 LAB — LIPID PANEL
Cholesterol: 131 mg/dL (ref 0–200)
HDL: 44 mg/dL (ref >39.00)
LDL Cholesterol: 71 mg/dL (ref 0–99)
NonHDL: 87.12
Total CHOL/HDL Ratio: 3
Triglycerides: 83 mg/dL (ref 0.0–149.0)
VLDL: 16.6 mg/dL (ref 0.0–40.0)

## 2024-10-03 LAB — TSH: TSH: 1.91 u[IU]/mL (ref 0.35–5.50)

## 2024-10-03 LAB — HEMOGLOBIN A1C: Hgb A1c MFr Bld: 7.5 % — ABNORMAL HIGH (ref 4.6–6.5)

## 2024-10-03 MED ORDER — POTASSIUM CHLORIDE CRYS ER 10 MEQ PO TBCR
10.0000 meq | EXTENDED_RELEASE_TABLET | Freq: Two times a day (BID) | ORAL | 1 refills | Status: AC
  Filled 2024-10-03: qty 180, 90d supply, fill #0
  Filled 2024-10-14: qty 60, 30d supply, fill #0
  Filled 2024-11-14: qty 60, 30d supply, fill #1
  Filled 2024-12-12 (×2): qty 60, 30d supply, fill #2

## 2024-10-03 MED ORDER — TRAMADOL HCL 50 MG PO TABS
50.0000 mg | ORAL_TABLET | Freq: Three times a day (TID) | ORAL | 0 refills | Status: AC | PRN
  Filled 2024-10-03: qty 15, 5d supply, fill #0

## 2024-10-03 MED ORDER — ROSUVASTATIN CALCIUM 40 MG PO TABS
40.0000 mg | ORAL_TABLET | Freq: Every day | ORAL | 1 refills | Status: AC
  Filled 2024-10-03: qty 30, 30d supply, fill #0
  Filled 2024-11-02: qty 30, 30d supply, fill #1
  Filled 2024-12-05: qty 30, 30d supply, fill #2

## 2024-10-03 MED ORDER — LOSARTAN POTASSIUM-HCTZ 100-25 MG PO TABS
1.0000 | ORAL_TABLET | Freq: Every day | ORAL | 1 refills | Status: AC
  Filled 2024-10-03: qty 30, 30d supply, fill #0
  Filled 2024-11-02: qty 30, 30d supply, fill #1
  Filled 2024-12-05: qty 30, 30d supply, fill #2

## 2024-10-03 MED ORDER — GLIPIZIDE ER 5 MG PO TB24
5.0000 mg | ORAL_TABLET | Freq: Every day | ORAL | 1 refills | Status: AC
  Filled 2024-10-03: qty 90, 90d supply, fill #0
  Filled 2024-10-14: qty 30, 30d supply, fill #0
  Filled 2024-11-14: qty 30, 30d supply, fill #1
  Filled 2024-12-12 (×2): qty 30, 30d supply, fill #2

## 2024-10-03 MED ORDER — DICLOFENAC SODIUM 75 MG PO TBEC
75.0000 mg | DELAYED_RELEASE_TABLET | Freq: Two times a day (BID) | ORAL | 2 refills | Status: AC
  Filled 2024-10-03 – 2024-10-14 (×2): qty 60, 30d supply, fill #0
  Filled 2024-11-14: qty 60, 30d supply, fill #1
  Filled 2024-12-12 (×2): qty 60, 30d supply, fill #2

## 2024-10-03 MED ORDER — TIRZEPATIDE 15 MG/0.5ML ~~LOC~~ SOAJ
15.0000 mg | SUBCUTANEOUS | 1 refills | Status: AC
  Filled 2024-10-03 – 2024-10-14 (×4): qty 2, 28d supply, fill #0
  Filled 2024-11-07: qty 2, 28d supply, fill #1
  Filled 2024-12-05: qty 2, 28d supply, fill #2

## 2024-10-03 MED ORDER — METFORMIN HCL 1000 MG PO TABS
1000.0000 mg | ORAL_TABLET | Freq: Two times a day (BID) | ORAL | 1 refills | Status: AC
  Filled 2024-10-03: qty 180, 90d supply, fill #0
  Filled 2024-10-14: qty 60, 30d supply, fill #0
  Filled 2024-11-14: qty 60, 30d supply, fill #1
  Filled 2024-12-12 (×2): qty 60, 30d supply, fill #2

## 2024-10-03 MED ORDER — KETOCONAZOLE 2 % EX SHAM
1.0000 | MEDICATED_SHAMPOO | CUTANEOUS | 3 refills | Status: AC
  Filled 2024-10-03: qty 120, 30d supply, fill #0
  Filled 2024-10-29: qty 120, 30d supply, fill #1
  Filled 2024-11-30: qty 120, 30d supply, fill #2

## 2024-10-03 NOTE — Progress Notes (Signed)
 Subjective:     Patient ID: Stephanie Mccall, female    DOB: 06-Nov-1971, 53 y.o.   MRN: [id number]  Chief Complaint  Patient presents with   Hyperlipidemia   Diabetes    Pt is here to go over Chronic Issues    Discussed the use of AI scribe software for clinical note transcription with the patient, who gave verbal consent to proceed.  History of Present Illness Stephanie Mccall is a 53 year old female with diabetes, hypertension, and hyperlipidemia who presents for follow-up after a recent fall and ongoing management of her chronic conditions.  She experienced a fall on Tuesday, resulting in pain in her right ribs. She went to the ER yesterday, where she was told there were no fractures, but she continues to experience pain from the fall. No new surgeries or significant changes in her condition have occurred since the incident.  For diabetes management, she takes metformin  1000 mg twice daily, glipizide  5 mg daily, and Mounjaro  12.5 mg weekly. She occasionally checks her blood sugar, noting levels sometimes in the 120s and occasionally below 100. She experiences low blood sugar symptoms infrequently, about once a month, often related to delayed meals. She tries to manage her diet but finds it challenging due to her work schedule and fatigue.  Regarding hypertension, she takes amlodipine  10 mg and losartan  HCT 100/25 mg. She checks her blood pressure at work about once or twice a month, with readings ranging from 130-140 to 156, though it was 116 today. She experiences headaches across her forehead, which have persisted for two weeks, and Excedrin has not been effective. She has a history of similar headaches that last a few weeks and then resolve. She also reports episodes of heart palpitations over the past two days, possibly related to caffeine intake from Excedrin Migraine.  For hyperlipidemia, she takes rosuvastatin  40 mg and reports doing well with this medication.  She is also taking  diclofenac  once daily, potassium twice daily, and has a week left of terbinafine  for her toes. She has experienced gastrointestinal issues with certain foods and drinks, particularly coffee, which she has since eliminated from her diet.  She mentions a need for a refill of her hair shampoo and is managing her medications regularly. She is off work for the next four days and plans to rest. No chest pain, shortness of breath, or leg swelling, but she reports heartburn and irregular heartbeat related to recent caffeine intake. She experiences headaches and occasional low blood sugar symptoms.    There are no preventive care reminders to display for this patient.  Past Medical History:  Diagnosis Date   Allergy    SEASONAL   Arthritis    SHOULDERS   Diabetes mellitus without complication (HCC)    Dysrhythmia    Fibroid    Hyperlipidemia    Hypertension    Sleep apnea     Past Surgical History:  Procedure Laterality Date   ABDOMINAL HYSTERECTOMY     partial. fibroids   ANKLE SURGERY Left    screws to straighten   CHOLECYSTECTOMY     HARDWARE REMOVAL Left 08/03/2023   Procedure: REMOVAL HARDWARE LEFT FOOT;  Surgeon: Harden Jerona GAILS, MD;  Location: [facility] OR;  Service: Orthopedics;  Laterality: Left;   KNEE ARTHROSCOPY Right      Current Outpatient Medications:    Accu-Chek Softclix Lancets lancets, Test up to twice daily as directed in the morning fasting and when hypoglycemic, Disp: 100 each, Rfl: 1  acetaminophen  (TYLENOL ) 500 MG tablet, Take 1,000 mg by mouth every 6 (six) hours as needed for headache, moderate pain or mild pain., Disp: , Rfl:    amLODipine  (NORVASC ) 10 MG tablet, Take 1 tablet (10 mg total) by mouth daily., Disp: 90 tablet, Rfl: 3   aspirin -acetaminophen -caffeine (EXCEDRIN MIGRAINE) 250-250-65 MG tablet, Take 2 tablets by mouth every 6 (six) hours as needed for headache., Disp: , Rfl:    Blood Glucose Monitoring Suppl (BLOOD GLUCOSE MONITOR SYSTEM) w/Device KIT, Test  up to twice daily as directed in the morning fasting and when hypoglycemic, Disp: 1 kit, Rfl: 0   Blood Glucose Monitoring Suppl (CONTOUR NEXT ONE) DEVI, Use as directed to monitor glucose twice daily., Disp: 1 each, Rfl: 3   estradiol  (ESTRACE ) 0.1 MG/GM vaginal cream, Place 1 Applicatorful vaginally daily at bedtime for 2 weeks, then 2 (two) times a week thereafter,, Disp: 42.5 g, Rfl: 1   glucose blood (CONTOUR NEXT TEST) test strip, Use to test glucose up to twice daily as directed in the morning and as needed for hypoglycemia, Disp: 50 strip, Rfl: 3   ketoconazole  (NIZORAL ) 2 % cream, Apply 1 Application topically 2 (two) times daily., Disp: 30 g, Rfl: 1   Microlet Lancets MISC, Use as directed to monitor glucose twice daily., Disp: 100 each, Rfl: 1   terbinafine  (LAMISIL ) 250 MG tablet, Take 1 tablet (250 mg total) by mouth daily., Disp: 90 tablet, Rfl: 0   tirzepatide  (MOUNJARO ) 15 MG/0.5ML Pen, Inject 15 mg into the skin once a week., Disp: 6 mL, Rfl: 1   traMADol  (ULTRAM ) 50 MG tablet, Take 1 tablet (50 mg total) by mouth every 8 (eight) hours as needed for up to 5 days., Disp: 15 tablet, Rfl: 0   triamcinolone  cream (KENALOG ) 0.1 %, APPLY CREAM EXTERNALLY TWICE DAILY FOR RASH, Disp: 30 g, Rfl: 0   diclofenac  (VOLTAREN ) 75 MG EC tablet, Take 1 tablet (75 mg total) by mouth 2 (two) times daily. Do not take with other NSAIDs such as Ibuprofen  or Mobic , Disp: 60 tablet, Rfl: 2   glipiZIDE  (GLUCOTROL  XL) 5 MG 24 hr tablet, Take 1 tablet (5 mg total) by mouth daily., Disp: 90 tablet, Rfl: 1   [START ON 10/06/2024] ketoconazole  (NIZORAL ) 2 % shampoo, Apply 1 Application topically 2 (two) times a week., Disp: 120 mL, Rfl: 3   losartan -hydrochlorothiazide  (HYZAAR ) 100-25 MG tablet, Take 1 tablet by mouth daily., Disp: 90 tablet, Rfl: 1   metFORMIN  (GLUCOPHAGE ) 1000 MG tablet, Take 1 tablet (1,000 mg total) by mouth 2 (two) times daily., Disp: 180 tablet, Rfl: 1   potassium chloride  (KLOR-CON  M) 10  MEQ tablet, Take 1 tablet (10 mEq total) by mouth 2 (two) times daily., Disp: 180 tablet, Rfl: 1   rosuvastatin  (CRESTOR ) 40 MG tablet, Take 1 tablet (40 mg total) by mouth daily., Disp: 90 tablet, Rfl: 1  Allergies  Allergen Reactions   Nitroglycerin Itching   ROS neg/noncontributory except as noted HPI/below      Objective:     BP 116/68 (BP Location: Left Arm, Patient Position: Sitting)   Pulse 60   Temp 97.7 F (36.5 C) (Temporal)   Ht 5' 6 (1.676 m)   Wt 268 lb (121.6 kg)   SpO2 98%   BMI 43.26 kg/m  Wt Readings from Last 3 Encounters:  10/03/24 268 lb (121.6 kg)  07/03/24 261 lb (118.4 kg)  04/02/24 265 lb 6 oz (120.4 kg)    Physical Exam VITALS: BP- 116/  GENERAL: Well developed, well nourished, no acute distress. HEAD EYES EARS NOSE THROAT: Normocephalic, atraumatic, conjunctiva not injected, sclera nonicteric. CARDIAC: Regular rate and rhythm, S1 S2 present, no murmur, dorsalis pedis 2 plus bilaterally. NECK: Supple, no thyromegaly, no nodes, no carotid bruits. LUNGS: Clear to auscultation bilaterally, no wheezes. ABDOMEN: Bowel sounds present, soft, non-tender, non-distended, no hepatosplenomegaly, no masses. EXTREMITIES: No edema. MUSCULOSKELETAL: No gross abnormalities. NEUROLOGICAL: Alert and oriented x3, cranial nerves II through XII intact. PSYCHIATRIC: Normal mood, good eye contact.  Reviewd er records.  PDMP checked       Assessment & Plan:  Controlled type 2 diabetes mellitus without complication, without long-term current use of insulin  (HCC) -     Comprehensive metabolic panel with GFR -     Hemoglobin A1c -     glipiZIDE  ER; Take 1 tablet (5 mg total) by mouth daily.  Dispense: 90 tablet; Refill: 1 -     metFORMIN  HCl; Take 1 tablet (1,000 mg total) by mouth 2 (two) times daily.  Dispense: 180 tablet; Refill: 1 -     Tirzepatide ; Inject 15 mg into the skin once a week.  Dispense: 6 mL; Refill: 1  Hypertension associated with diabetes (HCC) -      CBC with Differential/Platelet -     Comprehensive metabolic panel with GFR -     Losartan  Potassium-HCTZ; Take 1 tablet by mouth daily.  Dispense: 90 tablet; Refill: 1  Hyperlipidemia associated with type 2 diabetes mellitus (HCC) -     Hemoglobin A1c -     Lipid panel -     Rosuvastatin  Calcium ; Take 1 tablet (40 mg total) by mouth daily.  Dispense: 90 tablet; Refill: 1  Palpitations -     TSH  Contusion of rib on right side, subsequent encounter  Other headache syndrome  Other orders -     Diclofenac  Sodium; Take 1 tablet (75 mg total) by mouth 2 (two) times daily. Do not take with other NSAIDs such as Ibuprofen  or Mobic   Dispense: 60 tablet; Refill: 2 -     Ketoconazole ; Apply 1 Application topically 2 (two) times a week.  Dispense: 120 mL; Refill: 3 -     Potassium Chloride  Crys ER; Take 1 tablet (10 mEq total) by mouth 2 (two) times daily.  Dispense: 180 tablet; Refill: 1 -     traMADol  HCl; Take 1 tablet (50 mg total) by mouth every 8 (eight) hours as needed for up to 5 days.  Dispense: 15 tablet; Refill: 0    Assessment and Plan Assessment & Plan Type 2 diabetes mellitus  -chronic, uncontrolled w/hyperglycemia Her diabetes is managed with metformin , glipizide , and Mounjaro . Blood glucose levels are variable, with occasional hypoglycemia. Mounjaro  causes constipation. Weight management is challenging due to lack of exercise. Continue metformin  1000 mg twice daily and glipizide  5 mg daily. Increase Mounjaro  to 15 mg weekly after the current supply is finished. Encourage regular exercise, such as a 15-minute walk daily. Discuss potential referral to a nutritionist for dietary guidance.  Hypertension   Her hypertension is managed with amlodipine  and losartan  HCT. Blood pressure readings are variable, with occasional elevations. Continue amlodipine  10 mg daily and losartan  HCT 100/25 mg daily. Encourage home blood pressure monitoring to assess variability. Discuss potential  stressors and lifestyle modifications to manage blood pressure.  Hyperlipidemia   Her hyperlipidemia is managed with rosuvastatin . Continue rosuvastatin  40 mg daily.  Constipation   Constipation is likely related to Mounjaro  use. Continue current management and monitor  symptoms.  Headache and palpitations   Headaches have persisted for two weeks, possibly related to stress or medication. Palpitations may be exacerbated by caffeine in Excedrin Migraine. Increase diclofenac  to twice daily for headache management. Discontinue Excedrin Migraine to assess impact on palpitations. Consider Tylenol  for headache relief.  Right rib pain after fall   She experiences right rib pain following a fall on Tuesday. No fracture was identified on the initial ER visit. Pain management is challenging due to potential dizziness from medications. Prescribe tramadol  for pain management, with caution regarding dizziness and driving. Continue diclofenac  and Tylenol  as needed for pain. Advise rest and limited activity during recovery.     Return in about 3 months (around 01/03/2025) for chronic follow-up.  Jenkins CHRISTELLA Carrel, MD

## 2024-10-03 NOTE — Progress Notes (Signed)
 Labs great except: Potassium barely normal-make sure taking it Sugars not much improvement. -has she ever taken jardiance-if not, add 10mg  daily #30/1-and needs to reck bmp in 1 month-dx high risk meds

## 2024-10-03 NOTE — Patient Instructions (Signed)
 It was very nice to see you today!  Happy Holidays  Increase diclofenac  to twice/day   PLEASE NOTE:  If you had any lab tests please let us  know if you have not heard back within a few days. You may see your results on MyChart before we have a chance to review them but we will give you a call once they are reviewed by us . If we ordered any referrals today, please let us  know if you have not heard from their office within the next week.   Please try these tips to maintain a healthy lifestyle:  Eat most of your calories during the day when you are active. Eliminate processed foods including packaged sweets (pies, cakes, cookies), reduce intake of potatoes, white bread, white pasta, and white rice. Look for whole grain options, oat flour or almond flour.  Each meal should contain half fruits/vegetables, one quarter protein, and one quarter carbs (no bigger than a computer mouse).  Cut down on sweet beverages. This includes juice, soda, and sweet tea. Also watch fruit intake, though this is a healthier sweet option, it still contains natural sugar! Limit to 3 servings daily.  Drink at least 1 glass of water with each meal and aim for at least 8 glasses per day  Exercise at least 150 minutes every week.

## 2024-10-14 ENCOUNTER — Other Ambulatory Visit (HOSPITAL_COMMUNITY): Payer: Self-pay

## 2024-10-14 ENCOUNTER — Telehealth (HOSPITAL_COMMUNITY): Payer: Self-pay | Admitting: Pharmacist

## 2024-10-14 ENCOUNTER — Other Ambulatory Visit: Payer: Self-pay

## 2024-10-14 NOTE — Telephone Encounter (Signed)
 This was rejecting as a cost exceeds maximum because she just got the 12.5 mg dose on 09/26/24. I spoke with the insurance company and they put in a dosage override. Her plan only allows 1 month at a time. I have submitted the fill in [facility] and her copay is $35.

## 2024-11-17 ENCOUNTER — Other Ambulatory Visit: Payer: Self-pay

## 2024-11-17 ENCOUNTER — Ambulatory Visit: Payer: PRIVATE HEALTH INSURANCE | Admitting: Physician Assistant

## 2024-11-17 DIAGNOSIS — M25561 Pain in right knee: Secondary | ICD-10-CM

## 2024-11-17 DIAGNOSIS — M25562 Pain in left knee: Secondary | ICD-10-CM

## 2024-11-17 DIAGNOSIS — M1712 Unilateral primary osteoarthritis, left knee: Secondary | ICD-10-CM | POA: Diagnosis not present

## 2024-11-17 NOTE — Progress Notes (Signed)
 "  Office Visit Note   Patient: Stephanie Mccall           Date of Birth: 20-Jul-1971           MRN: [id number] Visit Date: 11/17/2024              Requested by: Wendolyn Jenkins Jansky, MD [address] [place],  [place] [address] PCP: Wendolyn Jenkins Jansky, MD  Chief Complaint  Patient presents with   Left Knee - Pain      HPI: 54 y/o female with Bilateral knee pain.  She has a remote history of right TKA and has history of early arthritis changes and pain in the left knee now.  She works 12 hour shifts and has pain with prolonged walking, stiffness after rest for a while and first thing in the am.  She denies injury or trauma.    Assessment & Plan: Visit Diagnoses:  1. Left knee pain, unspecified chronicity   2. Right knee pain, unspecified chronicity   3. Arthritis of left knee     Plan: Right TKA with pai and left OA knee with medial joint line narrowing Sterorid injection was placed in the left Knee joint. We discussed that she should start a strengthening exercise program to maintain her right TKA and protect the left OA knee from pain.  Plan she will return as needed.  She can get an injection every three months as needed.  OTC antiinflammatories and ice PRN.    Follow-Up Instructions: Return if symptoms worsen or fail to improve.   Ortho Exam  Patient is alert, oriented, no adenopathy, well-dressed, normal affect, normal respiratory effort.  Tenderness medial joint line left knee, limited movement with flexion, minimal swelling, no instability, crepitus left patella, gait changes with antalgic gait after prolonged sitting.   No cellulitis.      Imaging: No lucency around the right TKA with well maintained joint space. Left medial joint line narrowing with slight varus deformity.  Subchondral Cyst and spurring.  Labs: Lab Results  Component Value Date   HGBA1C 7.5 (H) 10/03/2024   HGBA1C 7.7 (A) 07/03/2024   HGBA1C 8.0 (H) 04/02/2024     Lab Results  Component Value  Date   ALBUMIN 4.6 10/03/2024   ALBUMIN 4.5 04/02/2024   ALBUMIN 4.6 07/03/2023    No results found for: MG Lab Results  Component Value Date   VD25OH 31.72 10/14/2021    No results found for: PREALBUMIN    Latest Ref Rng & Units 10/03/2024    8:18 AM 04/02/2024    8:53 AM 01/04/2024    4:45 PM  CBC EXTENDED  WBC 4.0 - 10.5 K/uL 4.2  4.6  5.3   RBC 3.87 - 5.11 Mil/uL 4.76  4.85  4.68   Hemoglobin 12.0 - 15.0 g/dL 86.8  86.6  86.9   HCT 36.0 - 46.0 % 39.5  40.6  39.5   Platelets 150.0 - 400.0 K/uL 265.0  254.0  305   NEUT# 1.4 - 7.7 K/uL 2.3  2.4  2,422   Lymph# 0.7 - 4.0 K/uL 1.5  1.7       There is no height or weight on file to calculate BMI.  Orders:  Orders Placed This Encounter  Procedures   Large Joint Inj   XR Knee 1-2 Views Left   XR Knee 1-2 Views Right   No orders of the defined types were placed in this encounter.    Procedures: Large Joint Inj: L  knee on 11/18/2024 8:56 AM Indications: pain and diagnostic evaluation Details: 22 G 1.5 in needle  Arthrogram: No  Medications: 5 mL lidocaine  (PF) 1 %; 40 mg methylPREDNISolone  acetate 40 MG/ML Outcome: tolerated well, no immediate complications Procedure, treatment alternatives, risks and benefits explained, specific risks discussed. Consent was given by the patient. Immediately prior to procedure a time out was called to verify the correct patient, procedure, equipment, support staff and site/side marked as required. Patient was prepped and draped in the usual sterile fashion.      Clinical Data: No additional findings.  ROS:  All other systems negative, except as noted in the HPI. Review of Systems  Objective: Vital Signs: There were no vitals taken for this visit.  Specialty Comments:  No specialty comments available.  PMFS History: Patient Active Problem List   Diagnosis Date Noted   Pain from implanted hardware 08/03/2023   Calcaneal spur of foot, left 08/03/2023   Tendonitis of  ankle, left 08/03/2023   Controlled type 2 diabetes mellitus without complication, without long-term current use of insulin  (HCC) 09/29/2021   Hypertension associated with diabetes (HCC) 09/29/2021   Hyperlipidemia associated with type 2 diabetes mellitus (HCC) 09/29/2021   Morbid obesity (HCC) 09/29/2021   Past Medical History:  Diagnosis Date   Allergy    SEASONAL   Arthritis    SHOULDERS   Diabetes mellitus without complication (HCC)    Dysrhythmia    Fibroid    Hyperlipidemia    Hypertension    Sleep apnea     Family History  Problem Relation Age of Onset   Cancer Maternal Aunt    Lung cancer Maternal Aunt    Cancer Maternal Uncle    Lung cancer Maternal Uncle    Cancer Maternal Grandmother    Breast cancer Maternal Grandmother    Colon cancer Neg Hx    Colon polyps Neg Hx    Crohn's disease Neg Hx    Esophageal cancer Neg Hx    Rectal cancer Neg Hx    Stomach cancer Neg Hx    Ulcerative colitis Neg Hx     Past Surgical History:  Procedure Laterality Date   ABDOMINAL HYSTERECTOMY     partial. fibroids   ANKLE SURGERY Left    screws to straighten   CHOLECYSTECTOMY     HARDWARE REMOVAL Left 08/03/2023   Procedure: REMOVAL HARDWARE LEFT FOOT;  Surgeon: Harden Jerona GAILS, MD;  Location: [facility] OR;  Service: Orthopedics;  Laterality: Left;   KNEE ARTHROSCOPY Right    Social History   Occupational History   Not on file  Tobacco Use   Smoking status: Never    Passive exposure: Past   Smokeless tobacco: Never  Vaping Use   Vaping status: Never Used  Substance and Sexual Activity   Alcohol use: Yes    Comment: occasoinally   Drug use: Never   Sexual activity: Not Currently    Birth control/protection: Surgical       "

## 2024-11-18 ENCOUNTER — Encounter: Payer: Self-pay | Admitting: Physician Assistant

## 2024-11-18 DIAGNOSIS — M1712 Unilateral primary osteoarthritis, left knee: Secondary | ICD-10-CM

## 2024-11-18 MED ORDER — METHYLPREDNISOLONE ACETATE 40 MG/ML IJ SUSP
40.0000 mg | INTRAMUSCULAR | Status: AC | PRN
  Administered 2024-11-18: 40 mg via INTRA_ARTICULAR

## 2024-11-18 MED ORDER — LIDOCAINE HCL (PF) 1 % IJ SOLN
5.0000 mL | INTRAMUSCULAR | Status: AC | PRN
  Administered 2024-11-18: 5 mL

## 2024-12-12 ENCOUNTER — Other Ambulatory Visit (HOSPITAL_COMMUNITY): Payer: Self-pay

## 2024-12-15 ENCOUNTER — Other Ambulatory Visit (HOSPITAL_COMMUNITY): Payer: Self-pay

## 2024-12-15 ENCOUNTER — Other Ambulatory Visit: Payer: Self-pay

## 2024-12-18 ENCOUNTER — Ambulatory Visit: Payer: PRIVATE HEALTH INSURANCE | Admitting: Physician Assistant

## 2024-12-18 ENCOUNTER — Encounter: Payer: Self-pay | Admitting: Physician Assistant

## 2024-12-18 DIAGNOSIS — M1712 Unilateral primary osteoarthritis, left knee: Secondary | ICD-10-CM

## 2024-12-18 NOTE — Progress Notes (Signed)
 "  Office Visit Note   Patient: Stephanie Mccall           Date of Birth: Dec 13, 1970           MRN: [id number] Visit Date: 12/18/2024              Requested by: Wendolyn Jenkins Jansky, MD [address] [place],  [place] [address] PCP: Wendolyn Jenkins Jansky, MD  Chief Complaint  Patient presents with   Left Knee - Pain      HPI: 54 y/o female with Bilateral knee pain.  She has a remote history of right TKA and has history of early arthritis changes and pain in the left knee now.  She works 12 hour shifts and has pain with prolonged walking, stiffness after rest for a while and first thing in the am.  She denies injury or trauma.    On her last office visit on 11/17/24 she received a steroid injection in the left knee.  She states it helped for about a week.  She has tried Diclofenac , rest, ice in the past and ace wrap compression.  She doesn't think she has the time off work or the financial resources she  needs to go through left TKA at this time and wants to know her options.   Assessment & Plan: Visit Diagnoses:  1. Arthritis of left knee     Plan: She will do home exercises to keep the muscles surrounding the joints strong. Compression with ace wrap, a knee compression sleeve rolls down  Ice/heat PRN to assist with pain.   I will request insurance authorization for Gel knee injections.  She will ultimately need a left TKA if all conservative measures fail and she is unable to do her job and ADL's.    Follow-Up Instructions: Return if symptoms worsen or fail to improve, for gel injection once approved.   Ortho Exam  Patient is alert, oriented, no adenopathy, well-dressed, normal affect, normal respiratory effort. Left knee without effusion or edema.  No cellulitis  Tenderness medial joint line left knee, limited movement with flexion, minimal swelling, no instability, crepitus left patella, gait changes with antalgic gait after prolonged sitting.    Imaging: Reviewed the x rays Left  medial joint line narrowing with slight varus deformity. Subchondral Cyst and spurring.   Labs: Lab Results  Component Value Date   HGBA1C 7.5 (H) 10/03/2024   HGBA1C 7.7 (A) 07/03/2024   HGBA1C 8.0 (H) 04/02/2024     Lab Results  Component Value Date   ALBUMIN 4.6 10/03/2024   ALBUMIN 4.5 04/02/2024   ALBUMIN 4.6 07/03/2023    No results found for: MG Lab Results  Component Value Date   VD25OH 31.72 10/14/2021    No results found for: PREALBUMIN    Latest Ref Rng & Units 10/03/2024    8:18 AM 04/02/2024    8:53 AM 01/04/2024    4:45 PM  CBC EXTENDED  WBC 4.0 - 10.5 K/uL 4.2  4.6  5.3   RBC 3.87 - 5.11 Mil/uL 4.76  4.85  4.68   Hemoglobin 12.0 - 15.0 g/dL 86.8  86.6  86.9   HCT 36.0 - 46.0 % 39.5  40.6  39.5   Platelets 150.0 - 400.0 K/uL 265.0  254.0  305   NEUT# 1.4 - 7.7 K/uL 2.3  2.4  2,422   Lymph# 0.7 - 4.0 K/uL 1.5  1.7       There is no height or weight on file  to calculate BMI.  Orders:  Orders Placed This Encounter  Procedures   Ambulatory request for injection medication   No orders of the defined types were placed in this encounter.    Procedures: No procedures performed  Clinical Data: No additional findings.  ROS:  All other systems negative, except as noted in the HPI. Review of Systems  Objective: Vital Signs: There were no vitals taken for this visit.  Specialty Comments:  No specialty comments available.  PMFS History: Patient Active Problem List   Diagnosis Date Noted   Pain from implanted hardware 08/03/2023   Calcaneal spur of foot, left 08/03/2023   Tendonitis of ankle, left 08/03/2023   Controlled type 2 diabetes mellitus without complication, without long-term current use of insulin  (HCC) 09/29/2021   Hypertension associated with diabetes (HCC) 09/29/2021   Hyperlipidemia associated with type 2 diabetes mellitus (HCC) 09/29/2021   Morbid obesity (HCC) 09/29/2021   Past Medical History:  Diagnosis Date   Allergy     SEASONAL   Arthritis    SHOULDERS   Diabetes mellitus without complication (HCC)    Dysrhythmia    Fibroid    Hyperlipidemia    Hypertension    Sleep apnea     Family History  Problem Relation Age of Onset   Cancer Maternal Aunt    Lung cancer Maternal Aunt    Cancer Maternal Uncle    Lung cancer Maternal Uncle    Cancer Maternal Grandmother    Breast cancer Maternal Grandmother    Colon cancer Neg Hx    Colon polyps Neg Hx    Crohn's disease Neg Hx    Esophageal cancer Neg Hx    Rectal cancer Neg Hx    Stomach cancer Neg Hx    Ulcerative colitis Neg Hx     Past Surgical History:  Procedure Laterality Date   ABDOMINAL HYSTERECTOMY     partial. fibroids   ANKLE SURGERY Left    screws to straighten   CHOLECYSTECTOMY     HARDWARE REMOVAL Left 08/03/2023   Procedure: REMOVAL HARDWARE LEFT FOOT;  Surgeon: Harden Jerona GAILS, MD;  Location: [facility] OR;  Service: Orthopedics;  Laterality: Left;   KNEE ARTHROSCOPY Right    Social History   Occupational History   Not on file  Tobacco Use   Smoking status: Never    Passive exposure: Past   Smokeless tobacco: Never  Vaping Use   Vaping status: Never Used  Substance and Sexual Activity   Alcohol use: Yes    Comment: occasoinally   Drug use: Never   Sexual activity: Not Currently    Birth control/protection: Surgical       "

## 2025-01-12 ENCOUNTER — Ambulatory Visit: Payer: PRIVATE HEALTH INSURANCE | Admitting: Family Medicine
